# Patient Record
Sex: Female | Born: 1960
Health system: Southern US, Community
[De-identification: ages and names within clinical notes are randomized; demographics above are authoritative.]

## PROBLEM LIST (undated history)

## (undated) DIAGNOSIS — A6000 Herpesviral infection of urogenital system, unspecified: Secondary | ICD-10-CM

## (undated) DIAGNOSIS — E785 Hyperlipidemia, unspecified: Secondary | ICD-10-CM

## (undated) DIAGNOSIS — R7301 Impaired fasting glucose: Secondary | ICD-10-CM

## (undated) DIAGNOSIS — I340 Nonrheumatic mitral (valve) insufficiency: Secondary | ICD-10-CM

## (undated) DIAGNOSIS — K635 Polyp of colon: Secondary | ICD-10-CM

## (undated) DIAGNOSIS — E039 Hypothyroidism, unspecified: Secondary | ICD-10-CM

## (undated) DIAGNOSIS — E559 Vitamin D deficiency, unspecified: Secondary | ICD-10-CM

## (undated) DIAGNOSIS — R011 Cardiac murmur, unspecified: Secondary | ICD-10-CM

## (undated) DIAGNOSIS — M461 Sacroiliitis, not elsewhere classified: Secondary | ICD-10-CM

## (undated) HISTORY — DX: Hypothyroidism, unspecified: E03.9

## (undated) HISTORY — DX: Sacroiliitis, not elsewhere classified: M46.1

## (undated) HISTORY — DX: Nonrheumatic mitral (valve) insufficiency: I34.0

## (undated) HISTORY — DX: Cardiac murmur, unspecified: R01.1

## (undated) HISTORY — DX: Impaired fasting glucose: R73.01

## (undated) HISTORY — DX: Hyperlipidemia, unspecified: E78.5

## (undated) HISTORY — DX: Polyp of colon: K63.5

## (undated) HISTORY — DX: Vitamin D deficiency, unspecified: E55.9

## (undated) HISTORY — DX: Herpesviral infection of urogenital system, unspecified: A60.00

## (undated) HISTORY — PX: TUBAL LIGATION: SHX77

---

## 2008-06-27 DIAGNOSIS — M461 Sacroiliitis, not elsewhere classified: Secondary | ICD-10-CM

## 2008-06-27 HISTORY — DX: Sacroiliitis, not elsewhere classified: M46.1

## 2009-06-27 DIAGNOSIS — R011 Cardiac murmur, unspecified: Secondary | ICD-10-CM

## 2009-06-27 DIAGNOSIS — I340 Nonrheumatic mitral (valve) insufficiency: Secondary | ICD-10-CM

## 2009-06-27 HISTORY — DX: Nonrheumatic mitral (valve) insufficiency: I34.0

## 2009-06-27 HISTORY — DX: Cardiac murmur, unspecified: R01.1

## 2013-06-27 DIAGNOSIS — K635 Polyp of colon: Secondary | ICD-10-CM

## 2013-06-27 HISTORY — PX: COLONOSCOPY W/ POLYPECTOMY: SHX1380

## 2013-06-27 HISTORY — DX: Polyp of colon: K63.5

## 2014-01-02 DIAGNOSIS — Z86010 Personal history of colonic polyps: Secondary | ICD-10-CM | POA: Insufficient documentation

## 2016-03-22 ENCOUNTER — Ambulatory Visit (INDEPENDENT_AMBULATORY_CARE_PROVIDER_SITE_OTHER): Payer: BLUE CROSS/BLUE SHIELD | Admitting: Family Medicine

## 2016-03-22 ENCOUNTER — Encounter: Payer: Self-pay | Admitting: Family Medicine

## 2016-03-22 VITALS — BP 135/88 | HR 74 | Temp 97.9°F | Resp 20 | Ht 62.5 in | Wt 130.5 lb

## 2016-03-22 DIAGNOSIS — Z7689 Persons encountering health services in other specified circumstances: Secondary | ICD-10-CM

## 2016-03-22 DIAGNOSIS — K635 Polyp of colon: Secondary | ICD-10-CM

## 2016-03-22 DIAGNOSIS — Z7189 Other specified counseling: Secondary | ICD-10-CM | POA: Diagnosis not present

## 2016-03-22 DIAGNOSIS — A6000 Herpesviral infection of urogenital system, unspecified: Secondary | ICD-10-CM | POA: Insufficient documentation

## 2016-03-22 MED ORDER — VALACYCLOVIR HCL 500 MG PO TABS: 500.0000 mg | ORAL_TABLET | Freq: Every day | ORAL | 3 refills | Status: DC

## 2016-03-22 NOTE — Patient Instructions (Signed)
It was a pleasure meeting you today.  I will refer you to GI to keep you on track with your colonoscopy.  I have refilled your med for 1 year.  Physical end of April 2018.  If need anything let us know. Welcome to [facility]!

## 2016-03-22 NOTE — Progress Notes (Signed)
Patient ID: Sabrina Allison, female  DOB: 06-21-1961, 55 y.o.   MRN: [id number] No care team member to display  Subjective:  Sabrina Allison is a 55 y.o.  female present for new patient establishment. All past medical history, surgical history, allergies, family history, immunizations, medications and social history were obtained and entered  in the electronic medical record today. All recent labs, ED visits and hospitalizations within the last year were reviewed. She has recently relocated from [place] this past summer.   Health maintenance:  Colonoscopy: completed 2015, polyps, completed in [place]. Precancerous polyps, recall in 3 years. GI referral today.  Mammogram: completed 08/12/2014 Cervical cancer screening: last pap: 2016, results: normal, est with GYN in [place] Immunizations: tdap 2017, Influenza declined today(encouraged yearly Infectious disease screening: HIV and Hep C screen unknown, await records.  DEXA: N/A  Immunization History  Administered Date(s) Administered  . Hepatitis A 12/04/2015  . Tdap 12/04/2015    Past Medical History:  Diagnosis Date  . Colon polyp   . Genital herpes    No Known Allergies History reviewed. No pertinent surgical history. Family History  Problem Relation Age of Onset  . Diabetes Father   . Alcohol abuse Maternal Grandfather    Social History   Social History  . Marital status: Married    Spouse name: N/A  . Number of children: N/A  . Years of education: N/A   Occupational History  . Not on file.   Social History Main Topics  . Smoking status: Never Smoker  . Smokeless tobacco: Never Used  . Alcohol use 1.2 - 1.8 oz/week    2 - 3 Shots of liquor per week  . Drug use:     Frequency: 2.0 times per week    Types: Marijuana  . Sexual activity: Yes    Partners: Male    Birth control/ protection: Surgical     Comment: married   Other Topics Concern  . Not on file   Social History Narrative   Married to Sabrina Allison. 3 children  Sabrina Allison   Master's Degree in teaching.    Drinks caffeine.    Takes a daily vitamin   Wears her seatbelt, bicycle helmet, smoke detector in the home.    Exercises routinely   Feels safe in her relationships.         Medication List       Accurate as of 03/22/16 10:54 AM. Always use your most recent med list.          multivitamin capsule Take 1 capsule by mouth daily.   valACYclovir 500 MG tablet Commonly known as:  VALTREX Take 1 tablet (500 mg total) by mouth daily.        No results found for this or any previous visit (from the past 2160 hour(s)).  Patient was never admitted.   ROS: 14 pt review of systems performed and negative (unless mentioned in an HPI)  Objective: BP 135/88 (BP Location: Right Arm, Patient Position: Sitting, Cuff Size: Normal)   Pulse 74   Temp 97.9 F (36.6 C)   Resp 20   Ht 5' 2.5" (1.588 m)   Wt 130 lb 8 oz (59.2 kg)   SpO2 98%   BMI 23.49 kg/m  Gen: Afebrile. No acute distress. Nontoxic in appearance, well-developed, well-nourished,  Thin caucasian female. Pleasant.  HENT: AT. [place].  MMM, no oral lesions. no Cough on exam, no hoarseness on exam. Eyes:Pupils Equal Round Reactive to light, Extraocular  movements intact,  Conjunctiva without redness, discharge or icterus. Neck/lymp/endocrine: Supple,no lymphadenopathy, no thyromegaly CV: RRR, no edema Chest: CTAB, no wheeze, rhonchi or crackles.  Abd: Soft.NTND. BS present.  Skin: Warm and well-perfused. Skin intact. Neuro/Msk: Normal gait. PERLA. EOMi. Alert. Oriented x3.   Psych: Normal affect, dress and demeanor. Normal speech. Normal thought content and judgment.   Assessment/plan: [name] is a 55 y.o. female present for establishment of care.  Establishing care with new doctor, encounter for Genital HSV - valtrex 500 mg QD refilled for 1 year.   Colon polyps - 2015 colon polyps, reccommended 3 year recall. Needs to establish with GI in [place].  -  Ambulatory referral to Gastroenterology  - pt declined flu shot today.   Greater than 30 minutes spent with patient, >50% of time spent face to face counseling patient and coordinating care.  Return in about 7 months (around 10/24/2016) for CPE.  Electronically signed by: Howard Pouch, DO [place]

## 2016-03-29 ENCOUNTER — Encounter: Payer: Self-pay | Admitting: Family Medicine

## 2016-03-29 DIAGNOSIS — R7989 Other specified abnormal findings of blood chemistry: Secondary | ICD-10-CM | POA: Insufficient documentation

## 2016-03-29 DIAGNOSIS — E785 Hyperlipidemia, unspecified: Secondary | ICD-10-CM | POA: Insufficient documentation

## 2016-04-20 ENCOUNTER — Encounter: Payer: Self-pay | Admitting: Family Medicine

## 2016-05-31 ENCOUNTER — Telehealth: Payer: Self-pay | Admitting: Internal Medicine

## 2016-07-11 NOTE — Telephone Encounter (Signed)
Done

## 2016-07-21 NOTE — Telephone Encounter (Signed)
Received path report and placed on Dr. Celesta Aver desk for review.

## 2016-07-22 NOTE — Telephone Encounter (Signed)
Dr.Gessner reviewed records and accepted pt. Okay to sch direct colon. Left message for pt to cb and sch

## 2016-09-12 NOTE — Telephone Encounter (Signed)
Patient has not scheduled appointment. Records will be in "records reviewed" folder

## 2016-11-25 ENCOUNTER — Encounter: Payer: Self-pay | Admitting: Internal Medicine

## 2016-11-25 ENCOUNTER — Ambulatory Visit (INDEPENDENT_AMBULATORY_CARE_PROVIDER_SITE_OTHER): Payer: BLUE CROSS/BLUE SHIELD | Admitting: Family Medicine

## 2016-11-25 ENCOUNTER — Encounter: Payer: Self-pay | Admitting: Family Medicine

## 2016-11-25 ENCOUNTER — Other Ambulatory Visit: Payer: Self-pay | Admitting: Family Medicine

## 2016-11-25 VITALS — BP 139/89 | HR 63 | Temp 98.3°F | Resp 20 | Ht 63.0 in | Wt 133.0 lb

## 2016-11-25 DIAGNOSIS — Z1231 Encounter for screening mammogram for malignant neoplasm of breast: Secondary | ICD-10-CM

## 2016-11-25 DIAGNOSIS — R946 Abnormal results of thyroid function studies: Secondary | ICD-10-CM | POA: Diagnosis not present

## 2016-11-25 DIAGNOSIS — Z1239 Encounter for other screening for malignant neoplasm of breast: Secondary | ICD-10-CM

## 2016-11-25 DIAGNOSIS — K635 Polyp of colon: Secondary | ICD-10-CM | POA: Diagnosis not present

## 2016-11-25 DIAGNOSIS — E784 Other hyperlipidemia: Secondary | ICD-10-CM

## 2016-11-25 DIAGNOSIS — Z131 Encounter for screening for diabetes mellitus: Secondary | ICD-10-CM

## 2016-11-25 DIAGNOSIS — Z0000 Encounter for general adult medical examination without abnormal findings: Secondary | ICD-10-CM | POA: Diagnosis not present

## 2016-11-25 DIAGNOSIS — E7849 Other hyperlipidemia: Secondary | ICD-10-CM

## 2016-11-25 DIAGNOSIS — R7989 Other specified abnormal findings of blood chemistry: Secondary | ICD-10-CM

## 2016-11-25 LAB — CBC WITH DIFFERENTIAL/PLATELET
Basophils Absolute: 0 10*3/uL (ref 0.0–0.1)
Basophils Relative: 0.8 % (ref 0.0–3.0)
EOS PCT: 1.1 % (ref 0.0–5.0)
Eosinophils Absolute: 0.1 10*3/uL (ref 0.0–0.7)
HEMATOCRIT: 41.6 % (ref 36.0–46.0)
HEMOGLOBIN: 14 g/dL (ref 12.0–15.0)
LYMPHS ABS: 1.1 10*3/uL (ref 0.7–4.0)
LYMPHS PCT: 22.5 % (ref 12.0–46.0)
MCHC: 33.5 g/dL (ref 30.0–36.0)
MCV: 99.2 fl (ref 78.0–100.0)
MONOS PCT: 6.6 % (ref 3.0–12.0)
Monocytes Absolute: 0.3 10*3/uL (ref 0.1–1.0)
Neutro Abs: 3.4 10*3/uL (ref 1.4–7.7)
Neutrophils Relative %: 69 % (ref 43.0–77.0)
Platelets: 226 10*3/uL (ref 150.0–400.0)
RBC: 4.2 Mil/uL (ref 3.87–5.11)
RDW: 13.2 % (ref 11.5–15.5)
WBC: 4.9 10*3/uL (ref 4.0–10.5)

## 2016-11-25 LAB — COMPREHENSIVE METABOLIC PANEL
ALBUMIN: 4.9 g/dL (ref 3.5–5.2)
ALK PHOS: 44 U/L (ref 39–117)
ALT: 21 U/L (ref 0–35)
AST: 24 U/L (ref 0–37)
BILIRUBIN TOTAL: 0.6 mg/dL (ref 0.2–1.2)
BUN: 18 mg/dL (ref 6–23)
CALCIUM: 9.9 mg/dL (ref 8.4–10.5)
CO2: 31 mEq/L (ref 19–32)
Chloride: 102 mEq/L (ref 96–112)
Creatinine, Ser: 0.61 mg/dL (ref 0.40–1.20)
GFR: 108.17 mL/min (ref >60.00)
Glucose, Bld: 86 mg/dL (ref 70–99)
Potassium: 5.2 mEq/L — ABNORMAL HIGH (ref 3.5–5.1)
Sodium: 139 mEq/L (ref 135–145)
TOTAL PROTEIN: 7.3 g/dL (ref 6.0–8.3)

## 2016-11-25 LAB — LIPID PANEL
CHOLESTEROL: 270 mg/dL — AB (ref 0–200)
HDL: 108.1 mg/dL (ref >39.00)
LDL Cholesterol: 149 mg/dL — ABNORMAL HIGH (ref 0–99)
NonHDL: 161.88
Total CHOL/HDL Ratio: 2
Triglycerides: 64 mg/dL (ref 0.0–149.0)
VLDL: 12.8 mg/dL (ref 0.0–40.0)

## 2016-11-25 LAB — TSH: TSH: 2.82 u[IU]/mL (ref 0.35–4.50)

## 2016-11-25 LAB — HEMOGLOBIN A1C: HEMOGLOBIN A1C: 5.3 % (ref 4.6–6.5)

## 2016-11-25 MED ORDER — ZOSTER VAC RECOMB ADJUVANTED 50 MCG/0.5ML IM SUSR: INTRAMUSCULAR | 1 refills | Status: DC

## 2016-11-25 NOTE — Patient Instructions (Signed)

## 2016-11-25 NOTE — Progress Notes (Signed)
Patient ID: Kristeen Lantz, female  DOB: January 12, 1961, 56 y.o.   MRN: [id number] Patient Care Team    Relationship Specialty Notifications Start End  Ma Hillock, DO PCP - General Family Medicine  03/29/16     Subjective:  Kiyonna Tortorelli is a 56 y.o.  female present for new patient establishment. All past medical history, surgical history, allergies, family history, immunizations, medications and social history were updated and entered  in the electronic medical record today.   Health maintenance: Reviewed and updated in first 2018 Colonoscopy: completed 2015, polyps, completed in [place]. Precancerous polyps, recall in 3 years. GI referral again today.  Mammogram: completed 08/12/2014, referral placed to breast center today Cervical cancer screening: last pap: 2016, results: normal, est with GYN in  Immunizations: tdap 2017, Influenza declined today(encouraged yearly), shingrix script provided today Infectious disease screening: HIV and Hep C Declined today. DEXA: N/A No hospitalization or ED visits.  Pt has dental home.    Immunization History  Administered Date(s) Administered  . Hepatitis A 12/04/2015  . Tdap 12/04/2015    Past Medical History:  Diagnosis Date  . Colon polyp 2015   tubular adenoma, sessile serrated adenoma  . Elevated fasting glucose   . Genital herpes   . Hyperlipidemia   . Hypothyroid   . Mitral regurgitation 2011   trace  . Murmur 2011  . Sacroiliitis ([place]) 2010  . Vitamin D deficiency    No Known Allergies Past Surgical History:  Procedure Laterality Date  . COLONOSCOPY W/ POLYPECTOMY  2015   tubular adenoma, sessile serrated adenoma - 3 year recall per records.   . TUBAL LIGATION     Family History  Problem Relation Age of Onset  . Hyperlipidemia Mother   . Hypertension Mother   . Diabetes Father 20       IDDM at age 66  . Alcohol abuse Maternal Grandfather   . Diabetes Brother    Social History   Social History  . Marital  status: Married    Spouse name: N/A  . Number of children: N/A  . Years of education: N/A   Occupational History  . Not on file.   Social History Main Topics  . Smoking status: Never Smoker  . Smokeless tobacco: Never Used  . Alcohol use 1.2 - 1.8 oz/week    2 - 3 Shots of liquor per week  . Drug use: Yes    Frequency: 2.0 times per week    Types: Marijuana  . Sexual activity: Yes    Partners: Male    Birth control/ protection: Surgical     Comment: married   Other Topics Concern  . Not on file   Social History Narrative   Married to [place]. 3 children [name]   Master's Degree in teaching.    Drinks caffeine.    Takes a daily vitamin   Wears her seatbelt, bicycle helmet, smoke detector in the home.    Exercises routinely   Feels safe in her relationships.       Allergies as of 11/25/2016   No Known Allergies     Medication List       Accurate as of 11/25/16  7:21 PM. Always use your most recent med list.          multivitamin capsule Take 1 capsule by mouth daily.   valACYclovir 500 MG tablet Commonly known as:  VALTREX Take 1 tablet (500 mg total) by mouth daily.  Zoster Vac Recomb Adjuvanted injection Commonly known as:  SHINGRIX %0 mcg IM once, repeat in 2-6 months x1        Recent Results (from the past 2160 hour(s))  CBC w/Diff     Status: None   Collection Time: 11/25/16  8:51 AM  Result Value Ref Range   WBC 4.9 4.0 - 10.5 K/uL   RBC 4.20 3.87 - 5.11 Mil/uL   Hemoglobin 14.0 12.0 - 15.0 g/dL   HCT 41.6 36.0 - 46.0 %   MCV 99.2 78.0 - 100.0 fl   MCHC 33.5 30.0 - 36.0 g/dL   RDW 13.2 11.5 - 15.5 %   Platelets 226.0 150.0 - 400.0 K/uL   Neutrophils Relative % 69.0 43.0 - 77.0 %   Lymphocytes Relative 22.5 12.0 - 46.0 %   Monocytes Relative 6.6 3.0 - 12.0 %   Eosinophils Relative 1.1 0.0 - 5.0 %   Basophils Relative 0.8 0.0 - 3.0 %   Neutro Abs 3.4 1.4 - 7.7 K/uL   Lymphs Abs 1.1 0.7 - 4.0 K/uL   Monocytes Absolute 0.3 0.1  - 1.0 K/uL   Eosinophils Absolute 0.1 0.0 - 0.7 K/uL   Basophils Absolute 0.0 0.0 - 0.1 K/uL  Comp Met (CMET)     Status: Abnormal   Collection Time: 11/25/16  8:51 AM  Result Value Ref Range   Sodium 139 135 - 145 mEq/L   Potassium 5.2 (H) 3.5 - 5.1 mEq/L   Chloride 102 96 - 112 mEq/L   CO2 31 19 - 32 mEq/L   Glucose, Bld 86 70 - 99 mg/dL   BUN 18 6 - 23 mg/dL   Creatinine, Ser 0.61 0.40 - 1.20 mg/dL   Total Bilirubin 0.6 0.2 - 1.2 mg/dL   Alkaline Phosphatase 44 39 - 117 U/L   AST 24 0 - 37 U/L   ALT 21 0 - 35 U/L   Total Protein 7.3 6.0 - 8.3 g/dL   Albumin 4.9 3.5 - 5.2 g/dL   Calcium 9.9 8.4 - 10.5 mg/dL   GFR 108.17 >60.00 mL/min  Lipid panel     Status: Abnormal   Collection Time: 11/25/16  8:51 AM  Result Value Ref Range   Cholesterol 270 (H) 0 - 200 mg/dL    Comment: ATP III Classification       Desirable:  < 200 mg/dL               Borderline High:  200 - 239 mg/dL          High:  > = 240 mg/dL   Triglycerides 64.0 0.0 - 149.0 mg/dL    Comment: Normal:  <150 mg/dLBorderline High:  150 - 199 mg/dL   HDL 108.10 >39.00 mg/dL   VLDL 12.8 0.0 - 40.0 mg/dL   LDL Cholesterol 149 (H) 0 - 99 mg/dL   Total CHOL/HDL Ratio 2     Comment:                Men          Women1/2 Average Risk     3.4          3.3Average Risk          5.0          4.42X Average Risk          9.6          7.13X Average Risk          15.0  11.0                       NonHDL 161.88     Comment: NOTE:  Non-HDL goal should be 30 mg/dL higher than patient's LDL goal (i.e. LDL goal of < 70 mg/dL, would have non-HDL goal of < 100 mg/dL)  HgB A1c     Status: None   Collection Time: 11/25/16  8:51 AM  Result Value Ref Range   Hgb A1c MFr Bld 5.3 4.6 - 6.5 %    Comment: Glycemic Control Guidelines for People with Diabetes:Non Diabetic:  <6%Goal of Therapy: <7%Additional Action Suggested:  >8%   TSH     Status: None   Collection Time: 11/25/16  8:51 AM  Result Value Ref Range   TSH 2.82 0.35 - 4.50  uIU/mL    Patient was never admitted.   ROS: 14 pt review of systems performed and negative (unless mentioned in an HPI)  Objective: BP 139/89 (BP Location: Left Arm, Patient Position: Sitting, Cuff Size: Normal)   Pulse 63   Temp 98.3 F (36.8 C)   Resp 20   Ht '5\' 3"'  (1.6 m)   Wt 133 lb (60.3 kg)   SpO2 98%   BMI 23.56 kg/m  Gen: Afebrile. No acute distress. Nontoxic in appearance, well-developed, well-nourished, thin Caucasian female. Very pleasant. HENT: AT. [place]. Bilateral TM visualized and normal in appearance. MMM. Bilateral nares without erythema or swelling. Throat without erythema or exudates. No cough, no hoarseness. Eyes:Pupils Equal Round Reactive to light, Extraocular movements intact,  Conjunctiva without redness, discharge or icterus. Neck/lymp/endocrine: Supple, no lymphadenopathy, no thyromegaly CV: RRR no murmur, no edema, +2/4 P posterior tibialis pulses Chest: CTAB, no wheeze or crackles Abd: Soft. Flat. NTND. BS present. No Masses palpated.  MSK: No erythema, no soft tissue swelling, no obvious deformities, full range of motion. Neurovascularly intact. Skin: No rashes, purpura or petechiae. Warm well perfused, intact Neuro:  Normal gait. PERLA. EOMi. Alert. Oriented. Cranial nerves II through XII intact. Muscle strength 5/5 upper and lower extremity. DTRs equal bilaterally. Psych: Normal affect, dress and demeanor. Normal speech. Normal thought content and judgment.  Assessment/plan: Lamerle Jabs is a 56 y.o. female present for CPE Other hyperlipidemia - Lipid panel Elevated TSH - TSH Encounter for preventive health examination - AVS provided for routine health maintenance education. Patient was encouraged to exercise greater than 150 minutes a week, consuming a healthy diet. Colonoscopy: completed 2015, polyps, completed in [place]. Precancerous polyps, recall in 3 years. GI referral again today.  Mammogram: completed 08/12/2014, referral placed to breast  center today Cervical cancer screening: last pap: 2016, results: normal, est with GYN in [place] Immunizations: tdap 2017, Influenza declined today(encouraged yearly), shingrix script provided today Infectious disease screening: HIV and Hep C Declined today. - CBC w/Diff - Comp Met (CMET) Screening for diabetes mellitus - HgB A1c Polyp of colon, unspecified part of colon, unspecified type - Placed referral again per patient, she just needs scheduled the [facility] does have her records now. - Ambulatory referral to Gastroenterology Breast cancer screening - Mammogram scheduled for the breast center    No Follow-up on file.  Electronically signed by: Howard Pouch, DO [place]

## 2016-11-28 ENCOUNTER — Telehealth: Payer: Self-pay | Admitting: Family Medicine

## 2016-11-28 NOTE — Telephone Encounter (Signed)
Please call pt: - he labs look great. Her total cholesterol number is high, but most of total cholesterol number  is from her HDL (good cholesterol) which is amazing in her. We want the HDL high and LDL <130. - Continue low saturated fat diet, higher fiber, and exercise. Could add a fish oil supplement if desire/tolerate to help lower LDL (bad cholesterol). Overall panel look good.   Lipid Panel     Component Value Date/Time   CHOL 270 (H) 11/25/2016 0851   TRIG 64.0 11/25/2016 0851   HDL 108.10 11/25/2016 0851   CHOLHDL 2 11/25/2016 0851   VLDL 12.8 11/25/2016 0851   LDLCALC 149 (H) 11/25/2016 4458

## 2016-11-28 NOTE — Telephone Encounter (Signed)
Left detailed message with lab results and instructions on patient voice mail per DPR.

## 2016-11-29 ENCOUNTER — Encounter: Payer: Self-pay | Admitting: Family Medicine

## 2016-12-05 ENCOUNTER — Ambulatory Visit (INDEPENDENT_AMBULATORY_CARE_PROVIDER_SITE_OTHER): Payer: BLUE CROSS/BLUE SHIELD | Admitting: Family Medicine

## 2016-12-05 ENCOUNTER — Encounter: Payer: Self-pay | Admitting: Family Medicine

## 2016-12-05 VITALS — BP 132/88 | HR 80 | Temp 98.0°F | Resp 16 | Wt 134.0 lb

## 2016-12-05 DIAGNOSIS — L258 Unspecified contact dermatitis due to other agents: Secondary | ICD-10-CM

## 2016-12-05 MED ORDER — METHYLPREDNISOLONE ACETATE 80 MG/ML IJ SUSP
80.0000 mg | Freq: Once | INTRAMUSCULAR | Status: AC
Start: 2016-12-05 — End: 2016-12-05
  Administered 2016-12-05: 80 mg via INTRAMUSCULAR

## 2016-12-05 NOTE — Progress Notes (Signed)
Sabrina Allison , [birth year], 56 y.o., female MRN: [id number] Patient Care Team    Relationship Specialty Notifications Start End  Ma Hillock, DO PCP - General Family Medicine  03/29/16     Chief Complaint  Patient presents with  . Rash    Bilateral leg, itching, x 8 days     Subjective: Pt presents for an OV with complaints of bilateral leg itchiness of 8 days duration.  Associated symptoms include rash. Pt states she had a rash on her left ankle after gardening a few days prior. They then went to the beach and she noticed the rash spread to the other leg. She denies pain. She thought it was HSV, she has a h/o genital HSV so she increased her valtrex over the last two days. She has not seen an improvement with valtrex use.   Depression screen [facility] 2/9 11/25/2016 03/22/2016  Decreased Interest 0 0  Down, Depressed, Hopeless 0 0  PHQ - 2 Score 0 0    No Known Allergies Social History  Substance Use Topics  . Smoking status: Never Smoker  . Smokeless tobacco: Never Used  . Alcohol use 1.2 - 1.8 oz/week    2 - 3 Shots of liquor per week   Past Medical History:  Diagnosis Date  . Colon polyp 2015   tubular adenoma, sessile serrated adenoma  . Elevated fasting glucose   . Genital herpes   . Hyperlipidemia   . Hypothyroid   . Mitral regurgitation 2011   trace  . Murmur 2011  . Sacroiliitis ([place]) 2010  . Vitamin D deficiency    Past Surgical History:  Procedure Laterality Date  . COLONOSCOPY W/ POLYPECTOMY  2015   tubular adenoma, sessile serrated adenoma - 3 year recall per records.   . TUBAL LIGATION     Family History  Problem Relation Age of Onset  . Hyperlipidemia Mother   . Hypertension Mother   . Diabetes Father 48       IDDM at age 58  . Alcohol abuse Maternal Grandfather   . Diabetes Brother    Allergies as of 12/05/2016   No Known Allergies     Medication List       Accurate as of 12/05/16 11:23 AM. Always use your most recent med list.          multivitamin capsule Take 1 capsule by mouth daily.   valACYclovir 500 MG tablet Commonly known as:  VALTREX Take 1 tablet (500 mg total) by mouth daily.   Zoster Vac Recomb Adjuvanted injection Commonly known as:  SHINGRIX %0 mcg IM once, repeat in 2-6 months x1       All past medical history, surgical history, allergies, family history, immunizations andmedications were updated in the EMR today and reviewed under the history and medication portions of their EMR.     ROS: Negative, with the exception of above mentioned in HPI   Objective:  BP (!) 147/96 (BP Location: Left Arm, Patient Position: Sitting, Cuff Size: Normal)   Pulse 80   Temp 98 F (36.7 C) (Oral)   Resp 16   Wt 134 lb (60.8 kg)   SpO2 96%   BMI 23.74 kg/m  Body mass index is 23.74 kg/m. Gen: Afebrile. No acute distress. Nontoxic in appearance, well developed, well nourished.  Skin:  Left medial ankle oval shaped vesicles and 1 small area by left medial knee, right calf with 4 small areas. No purpura or petechiae.   No  exam data present No results found. No results found for this or any previous visit (from the past 24 hour(s)).  Assessment/Plan: Sabrina Allison is a 56 y.o. female present for OV for  Contact dermatitis due to other agent, unspecified contact dermatitis type - appears to be plant dermatitis.  - since spreading offered IM depo medrol, and pt agreed today.  - avoid scratching. OTC calamine lotion. Steroid cream if needed.  - F/U PRN  Reviewed expectations re: course of current medical issues.  Discussed self-management of symptoms.  Outlined signs and symptoms indicating need for more acute intervention.  Patient verbalized understanding and all questions were answered.  Patient received an After-Visit Summary.   Note is dictated utilizing voice recognition software. Although note has been proof read prior to signing, occasional typographical errors still can be missed. If any  questions arise, please do not hesitate to call for verification.   electronically signed by:  Howard Pouch, DO  [place]

## 2016-12-05 NOTE — Patient Instructions (Signed)
Keep area covered with calamine lotion to dry it.  Steroid shot today to help.  If this is sumac or oak can take  A few weeks to resolve.  Wear pants, keep covered, try not to scratch.   Needs to dry out.     Poison Ivy Dermatitis Poison ivy dermatitis is redness and soreness (inflammation) of the skin. It is caused by a chemical that is found on the leaves of the poison ivy plant. You may also have itching, a rash, and blisters. Symptoms often clear up in 1-2 weeks. You may get this condition by touching a poison ivy plant. You can also get it by touching something that has the chemical on it. This may include animals or objects that have come in contact with the plant. Follow these instructions at home: General instructions  Take or apply over-the-counter and prescription medicines only as told by your doctor.  If you touch poison ivy, wash your skin with soap and cold water right away.  Use hydrocortisone creams or calamine lotion as needed to help with itching.  Take oatmeal baths as needed. Use colloidal oatmeal. You can get this at a pharmacy or grocery store. Follow the instructions on the package.  Do not scratch or rub your skin.  While you have the rash, wash your clothes right after you wear them. Prevention  Know what poison ivy looks like so you can avoid it. This plant has three leaves with flowering branches on a single stem. The leaves are glossy. They have uneven edges that come to a point at the front.  If you have touched poison ivy, wash with soap and water right away. Be sure to wash under your fingernails.  When hiking or camping, wear long pants, a long-sleeved shirt, tall socks, and hiking boots. You can also use a lotion on your skin that helps to prevent contact with the chemical on the plant.  If you think that your clothes or outdoor gear came in contact with poison ivy, rinse them off with a garden hose before you bring them inside your house. Contact  a doctor if:  You have open sores in the rash area.  You have more redness, swelling, or pain in the affected area.  You have redness that spreads beyond the rash area.  You have fluid, blood, or pus coming from the affected area.  You have a fever.  You have a rash over a large area of your body.  You have a rash on your eyes, mouth, or genitals.  Your rash does not get better after a few days. Get help right away if:  Your face swells or your eyes swell shut.  You have trouble breathing.  You have trouble swallowing. This information is not intended to replace advice given to you by your health care provider. Make sure you discuss any questions you have with your health care provider. Document Released: 07/16/2010 Document Revised: 11/19/2015 Document Reviewed: 11/19/2014 Elsevier Interactive Patient Education  [employer].

## 2016-12-07 ENCOUNTER — Ambulatory Visit
Admission: RE | Admit: 2016-12-07 | Discharge: 2016-12-07 | Disposition: A | Discharge status: Home / Self Care | Payer: BLUE CROSS/BLUE SHIELD | Source: Ambulatory Visit | Attending: Family Medicine | Admitting: Family Medicine

## 2016-12-07 DIAGNOSIS — Z1231 Encounter for screening mammogram for malignant neoplasm of breast: Secondary | ICD-10-CM

## 2016-12-29 ENCOUNTER — Ambulatory Visit (AMBULATORY_SURGERY_CENTER): Payer: Self-pay

## 2016-12-29 VITALS — Ht 62.5 in | Wt 133.0 lb

## 2016-12-29 DIAGNOSIS — Z1211 Encounter for screening for malignant neoplasm of colon: Secondary | ICD-10-CM

## 2016-12-29 NOTE — Progress Notes (Signed)
No allergies to eggs or soy No diet meds No home oxygem No past problems with anesthesia  Registered emmi

## 2017-01-09 ENCOUNTER — Encounter: Payer: Self-pay | Admitting: Internal Medicine

## 2017-01-12 ENCOUNTER — Ambulatory Visit (AMBULATORY_SURGERY_CENTER): Payer: BLUE CROSS/BLUE SHIELD | Admitting: Internal Medicine

## 2017-01-12 ENCOUNTER — Encounter: Payer: Self-pay | Admitting: Internal Medicine

## 2017-01-12 VITALS — BP 137/89 | HR 51 | Temp 98.7°F | Resp 14 | Ht 68.5 in | Wt 173.0 lb

## 2017-01-12 DIAGNOSIS — D123 Benign neoplasm of transverse colon: Secondary | ICD-10-CM | POA: Diagnosis not present

## 2017-01-12 DIAGNOSIS — Z86010 Personal history of colonic polyps: Secondary | ICD-10-CM | POA: Diagnosis not present

## 2017-01-12 MED ORDER — SODIUM CHLORIDE 0.9 % IV SOLN: 500.0000 mL | INTRAVENOUS | Status: DC

## 2017-01-12 NOTE — Progress Notes (Signed)
Called to room to assist during endoscopic procedure.  Patient ID and intended procedure confirmed with present staff. Received instructions for my participation in the procedure from the performing physician.  

## 2017-01-12 NOTE — Progress Notes (Signed)
Report to PACU, RN, vss, BBS= Clear.  

## 2017-01-12 NOTE — Progress Notes (Signed)
I have reviewed the patient's medical history in detail and updated the computerized patient record.

## 2017-01-12 NOTE — Patient Instructions (Addendum)
I found and removed a very tiny polyp. I will let you know pathology results and when to have another routine colonoscopy by mail and/or My Chart. It should be at least 5 years from now.  I appreciate the opportunity to care for you. Gatha Mayer, MD, FACG   YOU HAD AN ENDOSCOPIC PROCEDURE TODAY AT [facility]:   Refer to the procedure report that was given to you for any specific questions about what was found during the examination.  If the procedure report does not answer your questions, please call your gastroenterologist to clarify.  If you requested that your care partner not be given the details of your procedure findings, then the procedure report has been included in a sealed envelope for you to review at your convenience later.  YOU SHOULD EXPECT: Some feelings of bloating in the abdomen. Passage of more gas than usual.  Walking can help get rid of the air that was put into your GI tract during the procedure and reduce the bloating. If you had a lower endoscopy (such as a colonoscopy or flexible sigmoidoscopy) you may notice spotting of blood in your stool or on the toilet paper. If you underwent a bowel prep for your procedure, you may not have a normal bowel movement for a few days.  Please Note:  You might notice some irritation and congestion in your nose or some drainage.  This is from the oxygen used during your procedure.  There is no need for concern and it should clear up in a day or so.  SYMPTOMS TO REPORT IMMEDIATELY:   Following lower endoscopy (colonoscopy or flexible sigmoidoscopy):  Excessive amounts of blood in the stool  Significant tenderness or worsening of abdominal pains  Swelling of the abdomen that is new, acute  Fever of 100F or higher    For urgent or emergent issues, a gastroenterologist can be reached at any hour by calling [phone].   DIET:  We do recommend a small meal at first, but then you may proceed to your  regular diet.  Drink plenty of fluids but you should avoid alcoholic beverages for 24 hours.  ACTIVITY:  You should plan to take it easy for the rest of today and you should NOT DRIVE or use heavy machinery until tomorrow (because of the sedation medicines used during the test).    FOLLOW UP: Our staff will call the number listed on your records the next business day following your procedure to check on you and address any questions or concerns that you may have regarding the information given to you following your procedure. If we do not reach you, we will leave a message.  However, if you are feeling well and you are not experiencing any problems, there is no need to return our call.  We will assume that you have returned to your regular daily activities without incident.  If any biopsies were taken you will be contacted by phone or by letter within the next 1-3 weeks.  Please call us at [phone] if you have not heard about the biopsies in 3 weeks.    SIGNATURES/CONFIDENTIALITY: You and/or your care partner have signed paperwork which will be entered into your electronic medical record.  These signatures attest to the fact that that the information above on your After Visit Summary has been reviewed and is understood.  Full responsibility of the confidentiality of this discharge information lies with you and/or your care-partner.  INFORMATION ON POLYPS AND HEMORRHOIDS GIVEN TO YOU TODAY

## 2017-01-12 NOTE — Op Note (Signed)
[facility] Patient Name: Sabrina Allison Procedure Date: 01/12/2017 1:54 PM MRN: [id number] Endoscopist: Gatha Mayer , MD Age: 56 Referring MD:  Date of Birth: 21-Mar-1961 Gender: Female Account #: [id number] Procedure:                Colonoscopy Indications:              Surveillance: Personal history of adenomatous                            polyps on last colonoscopy 3 years ago Medicines:                Propofol per Anesthesia, Monitored Anesthesia Care Procedure:                Pre-Anesthesia Assessment:                           - Prior to the procedure, a History and Physical                            was performed, and patient medications and                            allergies were reviewed. The patient's tolerance of                            previous anesthesia was also reviewed. The risks                            and benefits of the procedure and the sedation                            options and risks were discussed with the patient.                            All questions were answered, and informed consent                            was obtained. Prior Anticoagulants: The patient has                            taken no previous anticoagulant or antiplatelet                            agents. ASA Grade Assessment: II - A patient with                            mild systemic disease. After reviewing the risks                            and benefits, the patient was deemed in                            satisfactory condition to undergo the procedure.  After obtaining informed consent, the colonoscope                            was passed under direct vision. Throughout the                            procedure, the patient's blood pressure, pulse, and                            oxygen saturations were monitored continuously. The                            Colonoscope was introduced through the anus and   advanced to the the cecum, identified by                            appendiceal orifice and ileocecal valve. The                            colonoscopy was performed without difficulty. The                            patient tolerated the procedure well. The quality                            of the bowel preparation was good. The ileocecal                            valve, appendiceal orifice, and rectum were                            photographed. The bowel preparation used was                            Miralax. Scope In: 2:08:11 PM Scope Out: 2:22:41 PM Scope Withdrawal Time: 0 hours 10 minutes 47 seconds  Total Procedure Duration: 0 hours 14 minutes 30 seconds  Findings:                 The perianal and digital rectal examinations were                            normal.                           A diminutive polyp was found in the transverse                            colon. The polyp was flat. The polyp was removed                            with a cold snare. Resection and retrieval were                            complete. Verification of patient identification  for the specimen was done. Estimated blood loss was                            minimal.                           Internal hemorrhoids were found during retroflexion.                           The exam was otherwise without abnormality on                            direct and retroflexion views. Complications:            No immediate complications. Estimated Blood Loss:     Estimated blood loss was minimal. Impression:               - One diminutive polyp in the transverse colon,                            removed with a cold snare. Resected and retrieved.                           - Internal hemorrhoids.                           - The examination was otherwise normal on direct                            and retroflexion views.                           - Personal history of colonic polyps.  tubular                            adenoma and ssa/p 2015 Recommendation:           - Patient has a contact number available for                            emergencies. The signs and symptoms of potential                            delayed complications were discussed with the                            patient. Return to normal activities tomorrow.                            Written discharge instructions were provided to the                            patient.                           - Resume previous diet.                           -  Continue present medications.                           - Repeat colonoscopy is recommended for                            surveillance. The colonoscopy date will be                            determined after pathology results from today's                            exam become available for review. Gatha Mayer, MD 01/12/2017 2:31:02 PM This report has been signed electronically.

## 2017-01-13 ENCOUNTER — Telehealth: Payer: Self-pay

## 2017-01-13 NOTE — Telephone Encounter (Signed)
  Follow up Call-  Call back number 01/12/2017  Post procedure Call Back phone  # [phone]  Permission to leave phone message Yes  Some recent data might be hidden     Patient questions:  Do you have a fever, pain , or abdominal swelling? No. Pain Score  0 *  Have you tolerated food without any problems? Yes.    Have you been able to return to your normal activities? Yes.    Do you have any questions about your discharge instructions: Diet   No. Medications  No. Follow up visit  No.  Do you have questions or concerns about your Care? No.  Actions: * If pain score is 4 or above: No action needed, pain <4.  No problems noted per pt. maw

## 2017-01-18 ENCOUNTER — Encounter: Payer: Self-pay | Admitting: Internal Medicine

## 2017-01-18 NOTE — Progress Notes (Signed)
Diminutive ssp Recall 2023 My Chart letter

## 2017-01-24 ENCOUNTER — Encounter: Payer: BLUE CROSS/BLUE SHIELD | Admitting: Internal Medicine

## 2017-01-31 ENCOUNTER — Other Ambulatory Visit: Payer: Self-pay | Admitting: *Deleted

## 2017-01-31 MED ORDER — VALACYCLOVIR HCL 500 MG PO TABS: 500.0000 mg | ORAL_TABLET | Freq: Every day | ORAL | 3 refills | Status: DC

## 2017-04-02 ENCOUNTER — Encounter: Payer: Self-pay | Admitting: Family Medicine

## 2017-04-03 ENCOUNTER — Other Ambulatory Visit: Payer: Self-pay | Admitting: *Deleted

## 2017-04-03 MED ORDER — VALACYCLOVIR HCL 500 MG PO TABS: 500.0000 mg | ORAL_TABLET | Freq: Every day | ORAL | 3 refills | Status: DC

## 2017-04-18 ENCOUNTER — Other Ambulatory Visit: Payer: Self-pay | Admitting: Family Medicine

## 2017-06-28 ENCOUNTER — Encounter: Payer: Self-pay | Admitting: Family Medicine

## 2018-01-09 ENCOUNTER — Encounter: Payer: Self-pay | Admitting: Family Medicine

## 2018-01-09 ENCOUNTER — Ambulatory Visit (INDEPENDENT_AMBULATORY_CARE_PROVIDER_SITE_OTHER): Payer: Managed Care, Other (non HMO) | Admitting: Family Medicine

## 2018-01-09 VITALS — BP 129/84 | HR 63 | Temp 98.1°F | Resp 20 | Ht 63.0 in | Wt 132.0 lb

## 2018-01-09 DIAGNOSIS — A6000 Herpesviral infection of urogenital system, unspecified: Secondary | ICD-10-CM

## 2018-01-09 DIAGNOSIS — Z79899 Other long term (current) drug therapy: Secondary | ICD-10-CM | POA: Diagnosis not present

## 2018-01-09 DIAGNOSIS — Z86010 Personal history of colonic polyps: Secondary | ICD-10-CM | POA: Diagnosis not present

## 2018-01-09 DIAGNOSIS — Z0000 Encounter for general adult medical examination without abnormal findings: Secondary | ICD-10-CM

## 2018-01-09 DIAGNOSIS — R7989 Other specified abnormal findings of blood chemistry: Secondary | ICD-10-CM | POA: Diagnosis not present

## 2018-01-09 DIAGNOSIS — Z01419 Encounter for gynecological examination (general) (routine) without abnormal findings: Secondary | ICD-10-CM | POA: Diagnosis not present

## 2018-01-09 DIAGNOSIS — E7849 Other hyperlipidemia: Secondary | ICD-10-CM

## 2018-01-09 DIAGNOSIS — Z130 Encounter for screening for diseases of the blood and blood-forming organs and certain disorders involving the immune mechanism: Secondary | ICD-10-CM | POA: Diagnosis not present

## 2018-01-09 DIAGNOSIS — Z131 Encounter for screening for diabetes mellitus: Secondary | ICD-10-CM

## 2018-01-09 DIAGNOSIS — Z23 Encounter for immunization: Secondary | ICD-10-CM | POA: Diagnosis not present

## 2018-01-09 LAB — CBC WITH DIFFERENTIAL/PLATELET
Basophils Absolute: 0 10*3/uL (ref 0.0–0.1)
Basophils Relative: 0.7 % (ref 0.0–3.0)
EOS PCT: 0.7 % (ref 0.0–5.0)
Eosinophils Absolute: 0 10*3/uL (ref 0.0–0.7)
HCT: 41.2 % (ref 36.0–46.0)
HEMOGLOBIN: 13.8 g/dL (ref 12.0–15.0)
LYMPHS ABS: 1.1 10*3/uL (ref 0.7–4.0)
Lymphocytes Relative: 29.5 % (ref 12.0–46.0)
MCHC: 33.5 g/dL (ref 30.0–36.0)
MCV: 101.2 fl — ABNORMAL HIGH (ref 78.0–100.0)
MONOS PCT: 8.2 % (ref 3.0–12.0)
Monocytes Absolute: 0.3 10*3/uL (ref 0.1–1.0)
Neutro Abs: 2.3 10*3/uL (ref 1.4–7.7)
Neutrophils Relative %: 60.9 % (ref 43.0–77.0)
Platelets: 190 10*3/uL (ref 150.0–400.0)
RBC: 4.08 Mil/uL (ref 3.87–5.11)
RDW: 14 % (ref 11.5–15.5)
WBC: 3.8 10*3/uL — ABNORMAL LOW (ref 4.0–10.5)

## 2018-01-09 LAB — LIPID PANEL
CHOLESTEROL: 275 mg/dL — AB (ref 0–200)
HDL: 120.7 mg/dL (ref >39.00)
LDL Cholesterol: 144 mg/dL — ABNORMAL HIGH (ref 0–99)
NonHDL: 154.64
Total CHOL/HDL Ratio: 2
Triglycerides: 51 mg/dL (ref 0.0–149.0)
VLDL: 10.2 mg/dL (ref 0.0–40.0)

## 2018-01-09 LAB — TSH: TSH: 4.97 u[IU]/mL — ABNORMAL HIGH (ref 0.35–4.50)

## 2018-01-09 LAB — COMPREHENSIVE METABOLIC PANEL
ALBUMIN: 4.8 g/dL (ref 3.5–5.2)
ALK PHOS: 44 U/L (ref 39–117)
ALT: 25 U/L (ref 0–35)
AST: 33 U/L (ref 0–37)
BUN: 17 mg/dL (ref 6–23)
CO2: 31 mEq/L (ref 19–32)
Calcium: 9.6 mg/dL (ref 8.4–10.5)
Chloride: 98 mEq/L (ref 96–112)
Creatinine, Ser: 0.62 mg/dL (ref 0.40–1.20)
GFR: 105.73 mL/min (ref >60.00)
Glucose, Bld: 89 mg/dL (ref 70–99)
POTASSIUM: 5 meq/L (ref 3.5–5.1)
Sodium: 137 mEq/L (ref 135–145)
TOTAL PROTEIN: 7.4 g/dL (ref 6.0–8.3)
Total Bilirubin: 0.7 mg/dL (ref 0.2–1.2)

## 2018-01-09 LAB — HEMOGLOBIN A1C: Hgb A1c MFr Bld: 5.3 % (ref 4.6–6.5)

## 2018-01-09 MED ORDER — VALACYCLOVIR HCL 500 MG PO TABS: 500.0000 mg | ORAL_TABLET | Freq: Every day | ORAL | 3 refills | Status: DC

## 2018-01-09 NOTE — Progress Notes (Signed)
Patient ID: Sabrina Allison, female  DOB: 1961-05-07, 57 y.o.   MRN: [id number] Patient Care Team    Relationship Specialty Notifications Start End  Ma Hillock, DO PCP - General Family Medicine  03/29/16     Chief Complaint  Patient presents with  . Annual Exam    Subjective:  Sabrina Allison is a 57 y.o.  Female  present for CPE . All past medical history, surgical history, allergies, family history, immunizations, medications and social history were updated in the electronic medical record today. All recent labs, ED visits and hospitalizations within the last year were reviewed.   Health maintenance: updated 01/09/18 Colonoscopy: completed 01/12/2017, polyps, 5 yr f/u, Dr. Carlean Purl Mammogram: completed 12/07/2016, breast center.[facility], desires every 2 yr screen. Referred to GYN Cervical cancer screening: last pap: 2017, results: normal, referred to gyn Immunizations: tdap 2017 UTD, Influenza (encouraged yearly), shingrix script provided today Infectious disease screening: HIV and Hep C Declined today. DEXA: N/A No hospitalization or ED visits.  Pt has dental home.   Depression screen [facility] 2/9 01/09/2018 11/25/2016 03/22/2016  Decreased Interest 0 0 0  Down, Depressed, Hopeless 0 0 0  PHQ - 2 Score 0 0 0   No flowsheet data found.   Current Exercise Habits: Structured exercise class, Type of exercise: walking;calisthenics, Time (Minutes): 60, Frequency (Times/Week): 3, Weekly Exercise (Minutes/Week): 180, Intensity: Intense Exercise limited by: None identified   Immunization History  Administered Date(s) Administered  . Hepatitis A 12/04/2015  . Tdap 12/04/2015    Past Medical History:  Diagnosis Date  . Colon polyp 2015   tubular adenoma, sessile serrated adenoma  . Elevated fasting glucose   . Genital herpes   . Hyperlipidemia   . Hypothyroid   . Mitral regurgitation 2011   trace  . Murmur 2011  . Sacroiliitis ([place]) 2010  . Vitamin D deficiency    No Known  Allergies Past Surgical History:  Procedure Laterality Date  . COLONOSCOPY W/ POLYPECTOMY  2015   tubular adenoma, sessile serrated adenoma - 3 year recall per records.   . TUBAL LIGATION     Family History  Problem Relation Age of Onset  . Hyperlipidemia Mother   . Hypertension Mother   . Diabetes Father 1       IDDM at age 35  . Alcohol abuse Maternal Grandfather   . Diabetes Brother   . Breast cancer Neg Hx   . Colon cancer Neg Hx    Social History   Socioeconomic History  . Marital status: Married    Spouse name: Not on file  . Number of children: Not on file  . Years of education: Not on file  . Highest education level: Not on file  Occupational History  . Not on file  Social Needs  . Financial resource strain: Not on file  . Food insecurity:    Worry: Not on file    Inability: Not on file  . Transportation needs:    Medical: Not on file    Non-medical: Not on file  Tobacco Use  . Smoking status: Never Smoker  . Smokeless tobacco: Never Used  Substance and Sexual Activity  . Alcohol use: Yes    Alcohol/week: 1.2 - 1.8 oz    Types: 2 - 3 Shots of liquor per week  . Drug use: Yes    Frequency: 2.0 times per week    Types: Marijuana  . Sexual activity: Yes    Partners: Male  Birth control/protection: Surgical    Comment: married  Lifestyle  . Physical activity:    Days per week: Not on file    Minutes per session: Not on file  . Stress: Not on file  Relationships  . Social connections:    Talks on phone: Not on file    Gets together: Not on file    Attends religious service: Not on file    Active member of club or organization: Not on file    Attends meetings of clubs or organizations: Not on file    Relationship status: Not on file  . Intimate partner violence:    Fear of current or ex partner: Not on file    Emotionally abused: Not on file    Physically abused: Not on file    Forced sexual activity: Not on file  Other Topics Concern  . Not  on file  Social History Narrative   Married to [place]. 3 children Sabrina Allison   Master's Degree in teaching.    Drinks caffeine.    Takes a daily vitamin   Wears her seatbelt, bicycle helmet, smoke detector in the home.    Exercises routinely   Feels safe in her relationships.    Allergies as of 01/09/2018   No Known Allergies     Medication List        Accurate as of 01/09/18  9:09 AM. Always use your most recent med list.          valACYclovir 500 MG tablet Commonly known as:  VALTREX Take 1 tablet (500 mg total) by mouth daily.       All past medical history, surgical history, allergies, family history, immunizations andmedications were updated in the EMR today and reviewed under the history and medication portions of their EMR.     No results found for this or any previous visit (from the past 2160 hour(s)).  Mm Digital Screening Bilateral  Result Date: 12/14/2016 CLINICAL DATA:  Screening. EXAM: DIGITAL SCREENING BILATERAL MAMMOGRAM WITH CAD COMPARISON:  Previous exam(s). ACR Breast Density Category b: There are scattered areas of fibroglandular density. FINDINGS: There are no findings suspicious for malignancy. Images were processed with CAD. IMPRESSION: No mammographic evidence of malignancy. A result letter of this screening mammogram will be mailed directly to the patient. RECOMMENDATION: Screening mammogram in one year. (Code:SM-B-01Y) BI-RADS CATEGORY  1: Negative. Electronically Signed   By: Franki Cabot M.D.   On: 12/14/2016 12:59     ROS: 14 pt review of systems performed and negative (unless mentioned in an HPI)  Objective: BP 129/84 (BP Location: Right Arm, Patient Position: Sitting, Cuff Size: Normal)   Pulse 63   Temp 98.1 F (36.7 C)   Resp 20   Ht _0  (1.6 m)   Wt 132 lb (59.9 kg)   SpO2 97%   BMI 23.38 kg/m  Gen: Afebrile. No acute distress. Nontoxic in appearance, well-developed, well-nourished,  Pleasant caucasian female.  HENT:  AT. [place]. Bilateral TM visualized and normal in appearance, normal external auditory canal. MMM, no oral lesions, adequate dentition. Bilateral nares within normal limits. Throat without erythema, ulcerations or exudates. no Cough on exam, no hoarseness on exam. Eyes:Pupils Equal Round Reactive to light, Extraocular movements intact,  Conjunctiva without redness, discharge or icterus. Neck/lymp/endocrine: Supple,no lymphadenopathy, no thyromegaly CV: RRR no murmur, noedema, +2/4 P posterior tibialis pulses. no carotid bruits. No JVD. Chest: CTAB, no wheeze, rhonchi or crackles. normal Respiratory effort. good Air movement. Abd: Soft.  flat. NTND. BS present. no Masses palpated. No hepatosplenomegaly. No rebound tenderness or guarding. Skin: no rashes, purpura or petechiae. Warm and well-perfused. Skin intact. Neuro/Msk:  Normal gait. PERLA. EOMi. Alert. Oriented x3.  Cranial nerves II through XII intact. Muscle strength 5/5 upper/lower extremity. DTRs equal bilaterally. Psych: Normal affect, dress and demeanor. Normal speech. Normal thought content and judgment.  No exam data present  Assessment/plan: Taisa Deloria is a 57 y.o. female present for CPE.   hyperlipidemia - Comp Met (CMET) - Lipid panel - TSH Screening for diabetes mellitus - HgB A1c Encounter for long-term (current) use of medications - Comp Met (CMET) Elevated TSH - TSH Screening for deficiency anemia - CBC w/Diff Genital herpes simplex, unspecified site Refilled valtrex Hx of colonic polyps Est. W/ Carlean Purl, followup due Encounter for gynecological examination without abnormal finding Establish for routine gyn care. Request female provider.  - Ambulatory referral to Gynecology Encounter for preventive health examination Patient was encouraged to exercise greater than 150 minutes a week. Patient was encouraged to choose a diet filled with fresh fruits and vegetables, and lean meats. AVS provided to patient today for  education/recommendation on gender specific health and safety maintenance. Colonoscopy: completed 01/12/2017, polyps, 5 yr f/u, Dr. Carlean Purl Mammogram: completed 12/07/2016, breast center.[facility], desires every 2 yr screen. Referred to GYN Cervical cancer screening: last pap: 2017, results: normal, referred to gyn Immunizations: tdap 2017 UTD, Influenza (encouraged yearly), shingrix  #1 provided today Infectious disease screening: HIV and Hep C Declined today. DEXA: N/A Return in about 1 year (around 01/10/2019) for CPE. 2-6 mos nurse visit for shingrix #2   * she is planning a trip to [place] in December, advised her to check CDC website on travel to [place] and make certain she has all recommended vaccines--> health Department should be able to provide. Or, can refer to [facility] if needed.    Electronically signed by:  Howard Pouch, DO [facility]

## 2018-01-09 NOTE — Patient Instructions (Signed)

## 2018-01-09 NOTE — Addendum Note (Signed)
Addended by: Leota Jacobsen on: 01/09/2018 03:48 PM   Modules accepted: Orders

## 2018-01-10 ENCOUNTER — Telehealth: Payer: Self-pay | Admitting: Family Medicine

## 2018-01-10 NOTE — Telephone Encounter (Signed)
Left detailed message with results and instructions on patient voice mail per DPR 

## 2018-01-10 NOTE — Telephone Encounter (Signed)
Please inform patient the following information: Her labs look good with the following exceptions:  - her cholesterol appears high, but her good cholesterol is amazingly high, which is good, and the cause of her elevate reading. Her bad (LDL) is 144- which is about the same as last year--> this is ok, below 130 and closer to 100 better. Increase exercise and fiber/decrease saturated fats will help lower- no meds needed.   - her blood cells are mildly enlarged. This can be seen in a many conditions including vitamin B deficiencies, thyroid disorder, malignancies or even an increase in consumption of alcohol. -Her thyroid test was also just barely abnormal as well. -Recommendations: Start an OTC B complex vitamin daily.  If she consumes alcohol, avoid daily use and more than 1-2 drinks when consuming.  Follow-up in 3 months with provider and we will discuss, and repeat labs.  If she would like she can have her Shingrix No. 2 shot on that day as well (save her from an extra trip).

## 2018-01-11 ENCOUNTER — Encounter: Payer: Self-pay | Admitting: Family Medicine

## 2018-04-11 ENCOUNTER — Ambulatory Visit: Payer: Managed Care, Other (non HMO)

## 2018-04-13 ENCOUNTER — Encounter: Payer: Self-pay | Admitting: Family Medicine

## 2018-04-13 ENCOUNTER — Ambulatory Visit: Payer: Managed Care, Other (non HMO) | Admitting: Family Medicine

## 2018-04-13 VITALS — BP 138/88 | HR 61 | Temp 97.8°F | Resp 20 | Ht 63.0 in | Wt 132.5 lb

## 2018-04-13 DIAGNOSIS — R7989 Other specified abnormal findings of blood chemistry: Secondary | ICD-10-CM | POA: Diagnosis not present

## 2018-04-13 DIAGNOSIS — Z23 Encounter for immunization: Secondary | ICD-10-CM

## 2018-04-13 DIAGNOSIS — D7589 Other specified diseases of blood and blood-forming organs: Secondary | ICD-10-CM | POA: Diagnosis not present

## 2018-04-13 DIAGNOSIS — Z79899 Other long term (current) drug therapy: Secondary | ICD-10-CM | POA: Diagnosis not present

## 2018-04-13 LAB — CBC WITH DIFFERENTIAL/PLATELET
Basophils Absolute: 0 10*3/uL (ref 0.0–0.1)
Basophils Relative: 0.5 % (ref 0.0–3.0)
EOS PCT: 0.9 % (ref 0.0–5.0)
Eosinophils Absolute: 0 10*3/uL (ref 0.0–0.7)
HEMATOCRIT: 40.9 % (ref 36.0–46.0)
Hemoglobin: 14 g/dL (ref 12.0–15.0)
Lymphocytes Relative: 28.5 % (ref 12.0–46.0)
Lymphs Abs: 1.1 10*3/uL (ref 0.7–4.0)
MCHC: 34.2 g/dL (ref 30.0–36.0)
MCV: 100.3 fl — AB (ref 78.0–100.0)
MONOS PCT: 8.6 % (ref 3.0–12.0)
Monocytes Absolute: 0.3 10*3/uL (ref 0.1–1.0)
NEUTROS ABS: 2.3 10*3/uL (ref 1.4–7.7)
Neutrophils Relative %: 61.5 % (ref 43.0–77.0)
PLATELETS: 217 10*3/uL (ref 150.0–400.0)
RBC: 4.08 Mil/uL (ref 3.87–5.11)
RDW: 13 % (ref 11.5–15.5)
WBC: 3.7 10*3/uL — ABNORMAL LOW (ref 4.0–10.5)

## 2018-04-13 LAB — VITAMIN B12: Vitamin B-12: 551 pg/mL (ref 211–911)

## 2018-04-13 LAB — TSH: TSH: 4.39 u[IU]/mL (ref 0.35–4.50)

## 2018-04-13 LAB — T4, FREE: Free T4: 0.62 ng/dL (ref 0.60–1.60)

## 2018-04-13 NOTE — Progress Notes (Signed)
Tailor Lucking , 11-23-1960, 57 y.o., female MRN: [id number] Patient Care Team    Relationship Specialty Notifications Start End  Ma Hillock, DO PCP - General Family Medicine  03/29/16     Chief Complaint  Patient presents with  . Follow-up     Subjective: Pt presents for an OV follow up to abnormal lab work.  Elevated TSH/Macrocytosis without anemia/Abnormal CBC Pt was found to elevated tsh, mcv and low WBC on routine lab work 3 months ago. She has started B complex. Refrained from alcohol consumption. She has been asymptomatic. She reports elevated TSH in the past that returned to normal.   Immunization due shingrix #2 due today   Depression screen [facility] 2/9 04/13/2018 01/09/2018 11/25/2016 03/22/2016  Decreased Interest 0 0 0 0  Down, Depressed, Hopeless 0 0 0 0  PHQ - 2 Score 0 0 0 0    No Known Allergies Social History   Tobacco Use  . Smoking status: Never Smoker  . Smokeless tobacco: Never Used  Substance Use Topics  . Alcohol use: Yes    Alcohol/week: 2.0 - 3.0 standard drinks    Types: 2 - 3 Shots of liquor per week   Past Medical History:  Diagnosis Date  . Colon polyp 2015   tubular adenoma, sessile serrated adenoma  . Elevated fasting glucose   . Genital herpes   . Hyperlipidemia   . Hypothyroid   . Mitral regurgitation 2011   trace  . Murmur 2011  . Sacroiliitis ([place]) 2010  . Vitamin D deficiency    Past Surgical History:  Procedure Laterality Date  . COLONOSCOPY W/ POLYPECTOMY  2015   tubular adenoma, sessile serrated adenoma - 3 year recall per records.   . TUBAL LIGATION     Family History  Problem Relation Age of Onset  . Hyperlipidemia Mother   . Hypertension Mother   . Diabetes Father 51       IDDM at age 38  . Alcohol abuse Maternal Grandfather   . Diabetes Brother   . Breast cancer Neg Hx   . Colon cancer Neg Hx    Allergies as of 04/13/2018   No Known Allergies     Medication List        Accurate as of 04/13/18  9:00  AM. Always use your most recent med list.          valACYclovir 500 MG tablet Commonly known as:  VALTREX Take 1 tablet (500 mg total) by mouth daily.       All past medical history, surgical history, allergies, family history, immunizations andmedications were updated in the EMR today and reviewed under the history and medication portions of their EMR.     ROS: Negative, with the exception of above mentioned in HPI   Objective:  BP 138/88 (BP Location: Right Arm, Patient Position: Sitting, Cuff Size: Normal)   Pulse 61   Temp 97.8 F (36.6 C)   Resp 20   Ht 5\' 3"  (1.6 m)   Wt 132 lb 8 oz (60.1 kg)   SpO2 100%   BMI 23.47 kg/m  Body mass index is 23.47 kg/m. Gen: Afebrile. No acute distress. Nontoxic in appearance, well developed, well nourished.  HENT: AT. [place]. MMM, no oral lesions.  Eyes:Pupils Equal Round Reactive to light, Extraocular movements intact,  Conjunctiva without redness, discharge or icterus. Neck/lymp/endocrine: Supple,no lymphadenopathy, no thyromegaly.  CV: RRR  Chest: CTAB, no wheeze or crackles.  No exam data present No  results found. No results found for this or any previous visit (from the past 24 hour(s)).  Assessment/Plan: Jenefer Woerner is a 57 y.o. female present for OV for  Elevated TSH/Macrocytosis without anemia/Abnormal CBC - Taking B-complex. Asymptomatic. Drinks 1-2 drinks a night sometimes.  - TSH - T4, free - Thyroid peroxidase antibody - CBC w/Diff - B12 Encounter for long-term current use of medication - CBC w/Diff - B12 Immunization due - Varicella-zoster vaccine IM  F/U dependent on labs.    Reviewed expectations re: course of current medical issues.  Discussed self-management of symptoms.  Outlined signs and symptoms indicating need for more acute intervention.  Patient verbalized understanding and all questions were answered.  Patient received an After-Visit Summary.    No orders of the defined types were  placed in this encounter.    Note is dictated utilizing voice recognition software. Although note has been proof read prior to signing, occasional typographical errors still can be missed. If any questions arise, please do not hesitate to call for verification.   electronically signed by:  Howard Pouch, DO  [facility]

## 2018-04-13 NOTE — Patient Instructions (Signed)
We will call you with results once results available.  Continue B vitamins.   Shingrix #2 completed today.

## 2018-04-16 ENCOUNTER — Telehealth: Payer: Self-pay | Admitting: Family Medicine

## 2018-04-16 LAB — THYROID PEROXIDASE ANTIBODY: Thyroperoxidase Ab SerPl-aCnc: 81 IU/mL — ABNORMAL HIGH (ref <9)

## 2018-04-16 NOTE — Telephone Encounter (Signed)
Please inform patient the following information: Her b12 was normal >> continue supplement.  Her RBC size is returning to normal at 100.3 Her thyroid test are as follows; the tsh is just barely in normal range and so is the T63free. Her thyroid antibodies are elevated>>> meaning she does not need medications now, as long as she remains asymptomatic. We will continue to follow with CPE yearly. With elevated antibodies she will more than likely, at some point as she ages, require thyroid supplement. Time will tell.

## 2018-04-17 NOTE — Telephone Encounter (Signed)
Left detailed message with results and instructions on patient voice mail per DPR 

## 2018-04-18 ENCOUNTER — Ambulatory Visit: Payer: Managed Care, Other (non HMO) | Admitting: Family Medicine

## 2018-05-12 IMAGING — MG DIGITAL SCREENING BILATERAL MAMMOGRAM WITH CAD
4 series · 4 of 4 positions shown · non-contrast
Comparison: Previous exam(s).

CLINICAL DATA: Screening.

EXAM:
DIGITAL SCREENING BILATERAL MAMMOGRAM WITH CAD

[R MLO]
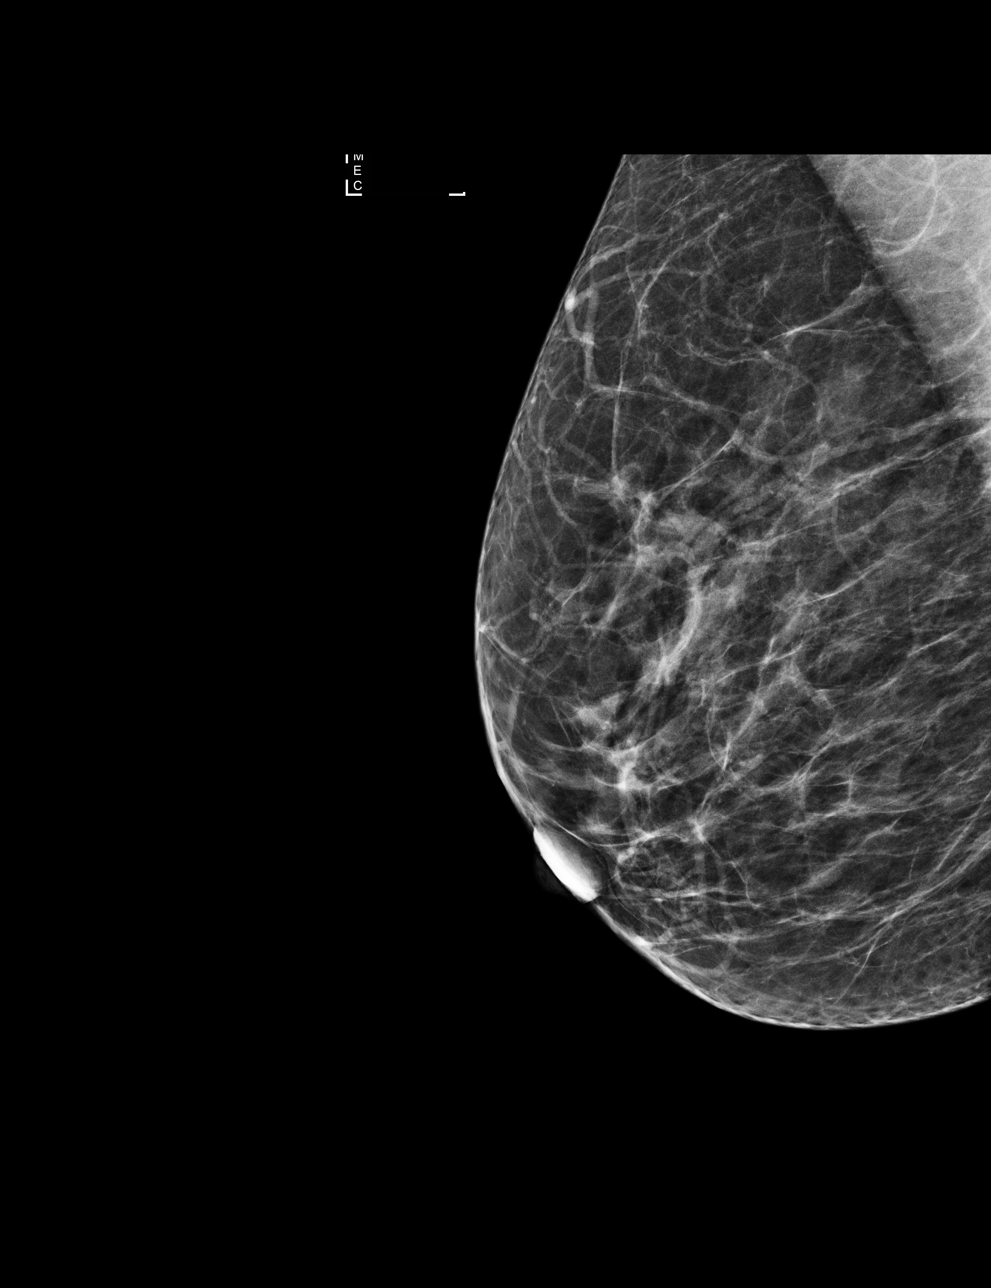

[L MLO]
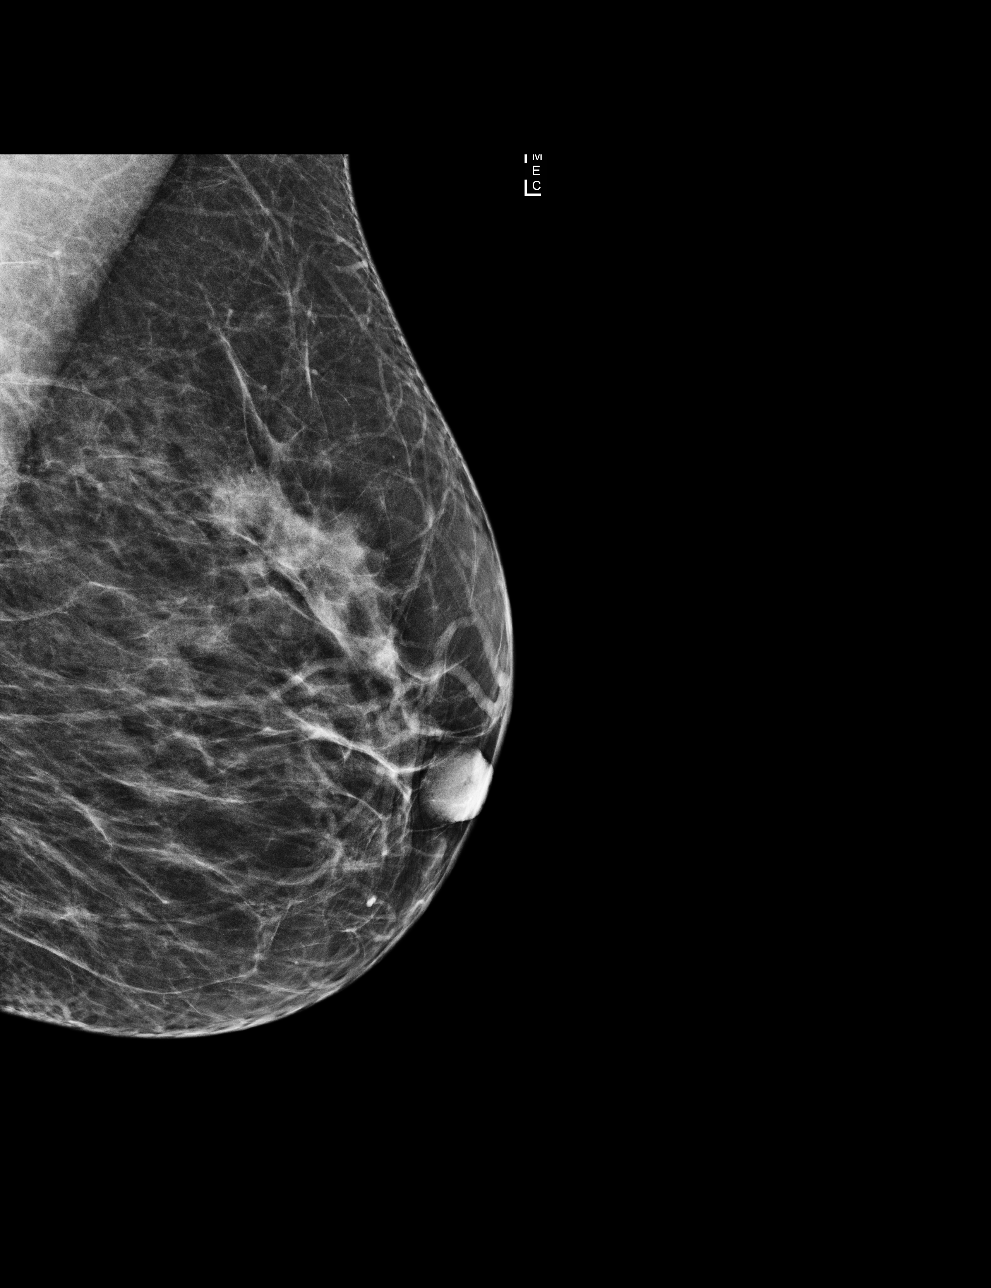

[L CC]
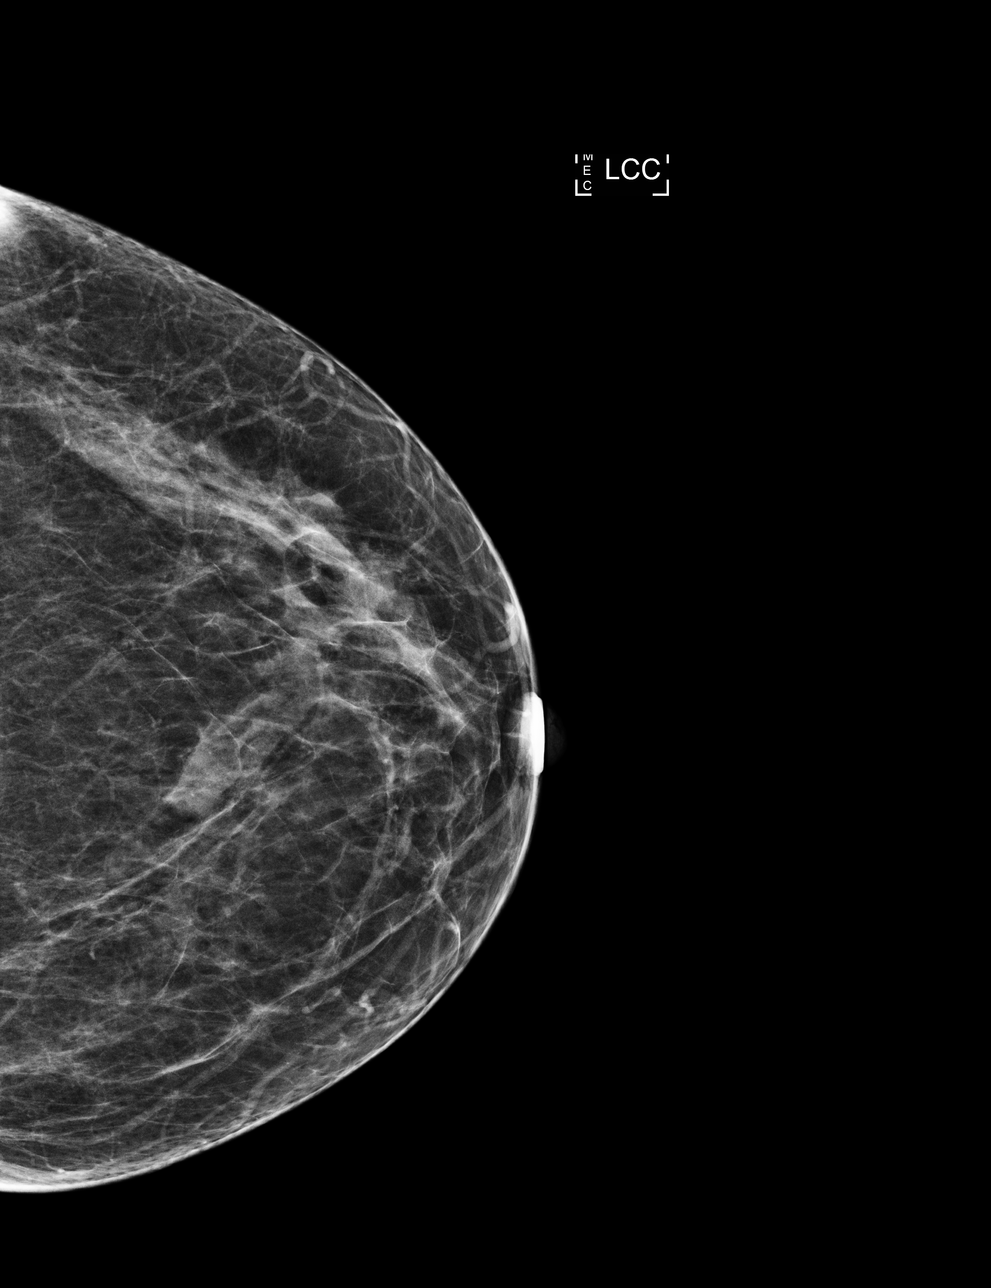

[R CC]
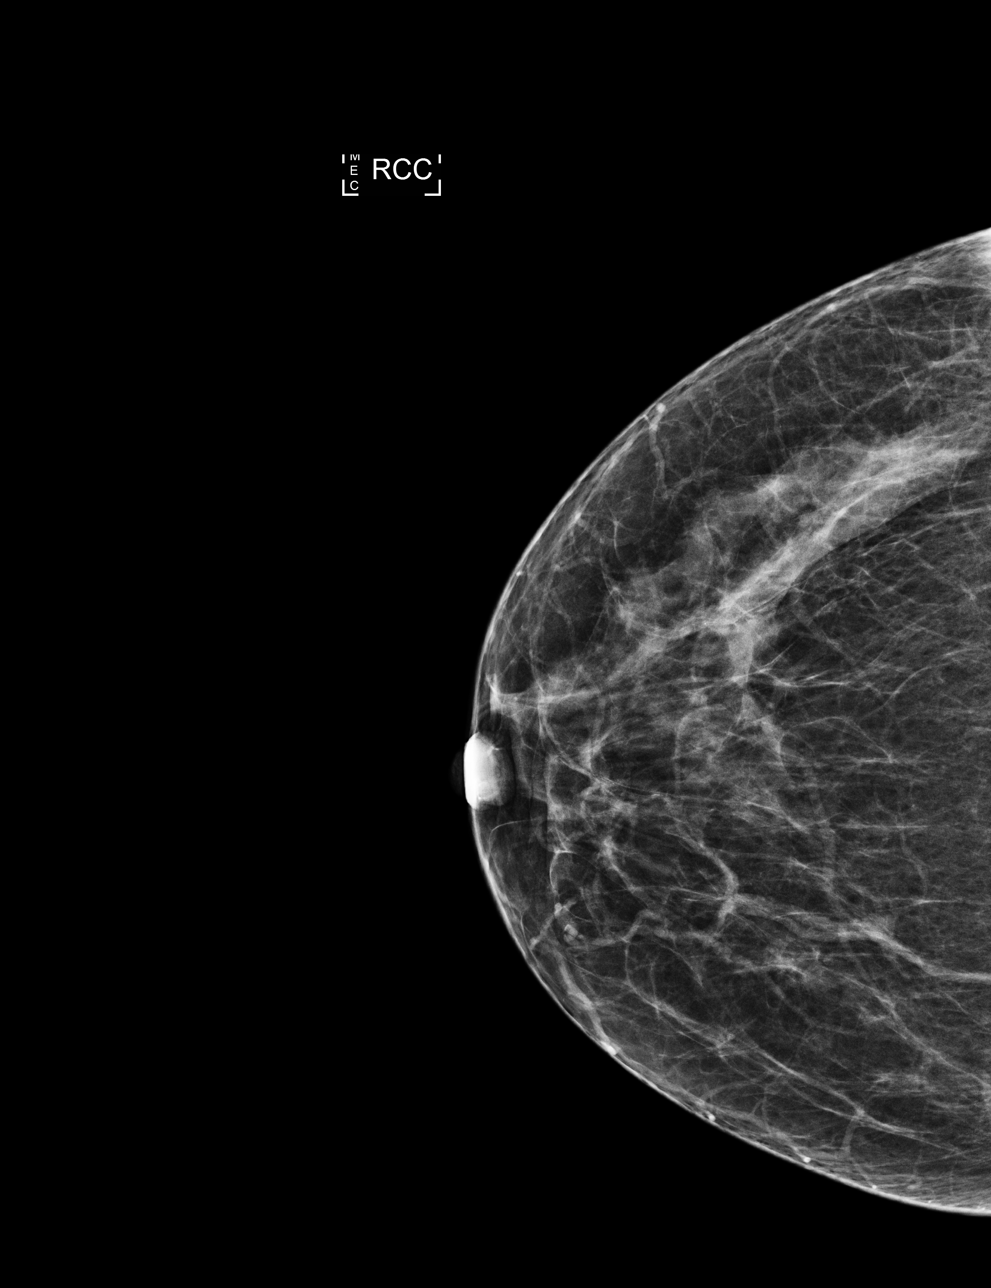

[4 of 4 positions shown; findings below may reference images not displayed]

ACR Breast Density Category b: There are scattered areas of
fibroglandular density.
FINDINGS: There are no findings suspicious for malignancy. Images were
processed with CAD.
IMPRESSION: No mammographic evidence of malignancy. A result letter of this
screening mammogram will be mailed directly to the patient.

RECOMMENDATION:
Screening mammogram in one year. (Code:AS-G-LCT)

BI-RADS CATEGORY  1: Negative.

## 2018-06-08 ENCOUNTER — Ambulatory Visit (INDEPENDENT_AMBULATORY_CARE_PROVIDER_SITE_OTHER): Payer: Managed Care, Other (non HMO) | Admitting: Internal Medicine

## 2018-06-08 DIAGNOSIS — Z7184 Encounter for health counseling related to travel: Secondary | ICD-10-CM

## 2018-06-08 DIAGNOSIS — Z789 Other specified health status: Secondary | ICD-10-CM

## 2018-06-08 DIAGNOSIS — Z9189 Other specified personal risk factors, not elsewhere classified: Secondary | ICD-10-CM

## 2018-06-08 DIAGNOSIS — Z7185 Encounter for immunization safety counseling: Secondary | ICD-10-CM

## 2018-06-08 DIAGNOSIS — Z298 Encounter for other specified prophylactic measures: Secondary | ICD-10-CM

## 2018-06-08 DIAGNOSIS — Z23 Encounter for immunization: Secondary | ICD-10-CM

## 2018-06-08 DIAGNOSIS — Z7189 Other specified counseling: Secondary | ICD-10-CM

## 2018-06-08 MED ORDER — TYPHOID VACCINE PO CPDR: 1.0000 | DELAYED_RELEASE_CAPSULE | ORAL | 0 refills | Status: DC

## 2018-06-08 MED ORDER — AZITHROMYCIN 500 MG PO TABS: 500.0000 mg | ORAL_TABLET | Freq: Every day | ORAL | 0 refills | Status: DC

## 2018-06-08 MED FILL — VIVOTIF EC CAPSULE: 8 days supply | Qty: 4 | Fill #0

## 2018-06-08 MED FILL — AZITHROMYCIN 500 MG TABLET: 500 | 5 days supply | Qty: 5 | Fill #0

## 2018-06-08 NOTE — Progress Notes (Signed)
  Subjective:   Sabrina Allison is a 57 y.o. female who presents to the Infectious Disease clinic for travel consultation. Planned departure date: dec 26th         Planned return date: 8 day trip - 4 days in [place] and [place] Countries of travel: [place] Areas in country: urban and rural Accommodations: hotel and cruise Purpose of travel: vacation Prior travel out of [place]: yes - [place], [place], and [place]     Objective:   Medications: valtrex    Assessment:   No contraindications to travel.    Plan:   Pre travel vaccinations - gave hep A #2 today and oral typhoid vaccine   Traveler's diarrhea = gave precautions and azithromycin to use if needed  Mosquito bite prevention = gave recs for deet and premethrin spraying of clothing. No malarone needed

## 2018-06-08 NOTE — Patient Instructions (Signed)
[facility]                [address], [place]                   [place], [place] [address]                      Phone: [phone]                        Fax: [phone]   Planned departure date: dec 26th        Planned return date: early january Countries of travel: [place]  Guidelines for the Prevention & Treatment of Traveler's Diarrhea  Prevention: "Boil it, Peel it, Lacinda Axon it, or Forget it"   the fewer chances -> lower risk: try to stick to food & water precautions as much as possible"   If it's "piping hot"; it is probably okay, if not, it may not be   Treatment   1) You should always take care to drink lots of fluids in order to avoid dehydration   2) You should bring medications with you in case you come down with a case of diarrhea   3) OTC = bring pepto-bismol - can take with initial abdominal symptoms;                    Imodium - can help slow down your intestinal tract, can help relief cramps                    and diarrhea, can take if no bloody diarrhea  Use azithromycin if needed for traveler's diarrhea  Immunizations received today: oral typhoid, and hep a #2  Prior to travel:  1) Be sure to pick up appropriate prescriptions, including medicine you take daily. Do not expect to be able to fill your prescriptions abroad.  2) Strongly consider obtaining traveler's insurance, including emergency evacuation insurance. Most plans in the [place] do not cover participants abroad. (see below for resources)  3) Register at the appropriate [place] embassy or consulate with travel dates so they are aware of your presence in-country and for helpful advice during travel using the [employer] (STEP, GreenNylon.com.cy).  4) Leave contact information with a relative or friend.  5) Keep a Research officer, political party, credit cards in case they become lost or stolen  6) Inform your credit card company that you  will be travelling abroad   During travel:  1) If you become ill and need medical advice, the [place] [employer]	[place] of the country you are traveling in general provides a list of [place]key[place] speaking doctors.  We are also available on MyChart for remote consultation if you register prior to travel. 2) Avoid motorcycles or scooters when at all possible. Traffic laws in many countries are lax and accidents occur frequently.  3) Do not take any unnecessary risks that you wouldn't do at home.   Resources:  -Country specific information: BlindResource.ca or GreenNylon.com.cy  -Press photographer (DEET, mosquito nets): [employer], [employer] store, [employer], [place] insurance options: gatewayplans.com; http://clayton-rivera.info/; travelguard.com or Good [employer], gninsurance.com or info@gninsurance .com, H1235423.   Post Travel:  If you return from your trip ill, call your primary care doctor or our travel clinic @ [phone].   Enjoy your trip and know that with proper pre-travel preparation, most  people have an enjoyable and uninterrupted trip!

## 2018-07-02 ENCOUNTER — Encounter: Payer: Self-pay | Admitting: Family Medicine

## 2018-12-13 ENCOUNTER — Encounter: Payer: Self-pay | Admitting: Family Medicine

## 2018-12-14 ENCOUNTER — Encounter: Payer: Self-pay | Admitting: Family Medicine

## 2018-12-14 MED ORDER — VALACYCLOVIR HCL 500 MG PO TABS: 500.0000 mg | ORAL_TABLET | Freq: Every day | ORAL | 0 refills | Status: DC

## 2019-01-11 ENCOUNTER — Encounter: Payer: Managed Care, Other (non HMO) | Admitting: Family Medicine

## 2019-01-14 ENCOUNTER — Other Ambulatory Visit: Payer: Self-pay | Admitting: Family Medicine

## 2019-01-14 DIAGNOSIS — Z1231 Encounter for screening mammogram for malignant neoplasm of breast: Secondary | ICD-10-CM

## 2019-01-17 ENCOUNTER — Other Ambulatory Visit: Payer: Self-pay

## 2019-01-17 ENCOUNTER — Ambulatory Visit (INDEPENDENT_AMBULATORY_CARE_PROVIDER_SITE_OTHER): Payer: Managed Care, Other (non HMO)

## 2019-01-17 DIAGNOSIS — Z1231 Encounter for screening mammogram for malignant neoplasm of breast: Secondary | ICD-10-CM

## 2019-01-23 ENCOUNTER — Other Ambulatory Visit: Payer: Self-pay

## 2019-01-23 ENCOUNTER — Other Ambulatory Visit (HOSPITAL_COMMUNITY)
Admission: RE | Admit: 2019-01-23 | Discharge: 2019-01-23 | Disposition: A | Discharge status: Home / Self Care | Payer: Managed Care, Other (non HMO) | Source: Ambulatory Visit | Attending: Family Medicine | Admitting: Family Medicine

## 2019-01-23 ENCOUNTER — Encounter: Payer: Self-pay | Admitting: Family Medicine

## 2019-01-23 ENCOUNTER — Ambulatory Visit (INDEPENDENT_AMBULATORY_CARE_PROVIDER_SITE_OTHER): Payer: Managed Care, Other (non HMO) | Admitting: Family Medicine

## 2019-01-23 VITALS — BP 127/80 | HR 70 | Temp 98.3°F | Resp 17 | Ht 62.0 in | Wt 135.5 lb

## 2019-01-23 DIAGNOSIS — A6000 Herpesviral infection of urogenital system, unspecified: Secondary | ICD-10-CM

## 2019-01-23 DIAGNOSIS — R7989 Other specified abnormal findings of blood chemistry: Secondary | ICD-10-CM | POA: Diagnosis not present

## 2019-01-23 DIAGNOSIS — Z01419 Encounter for gynecological examination (general) (routine) without abnormal findings: Secondary | ICD-10-CM | POA: Diagnosis present

## 2019-01-23 DIAGNOSIS — Z131 Encounter for screening for diabetes mellitus: Secondary | ICD-10-CM

## 2019-01-23 DIAGNOSIS — K635 Polyp of colon: Secondary | ICD-10-CM | POA: Diagnosis not present

## 2019-01-23 DIAGNOSIS — Z130 Encounter for screening for diseases of the blood and blood-forming organs and certain disorders involving the immune mechanism: Secondary | ICD-10-CM | POA: Diagnosis not present

## 2019-01-23 DIAGNOSIS — Z0000 Encounter for general adult medical examination without abnormal findings: Secondary | ICD-10-CM | POA: Diagnosis not present

## 2019-01-23 DIAGNOSIS — H6121 Impacted cerumen, right ear: Secondary | ICD-10-CM

## 2019-01-23 DIAGNOSIS — E7849 Other hyperlipidemia: Secondary | ICD-10-CM | POA: Diagnosis not present

## 2019-01-23 LAB — HM PAP SMEAR

## 2019-01-23 MED ORDER — VALACYCLOVIR HCL 500 MG PO TABS: 500.0000 mg | ORAL_TABLET | Freq: Every day | ORAL | 3 refills | Status: DC

## 2019-01-23 MED ORDER — DEBROX 6.5 % OT SOLN: 5.0000 [drp] | Freq: Every day | OTIC | 0 refills | Status: DC

## 2019-01-23 NOTE — Patient Instructions (Signed)
Health Maintenance, Female Adopting a healthy lifestyle and getting preventive care are important in promoting health and wellness. Ask your health care provider about:  The right schedule for you to have regular tests and exams.  Things you can do on your own to prevent diseases and keep yourself healthy. What should I know about diet, weight, and exercise? Eat a healthy diet   Eat a diet that includes plenty of vegetables, fruits, low-fat dairy products, and lean protein.  Do not eat a lot of foods that are high in solid fats, added sugars, or sodium. Maintain a healthy weight Body mass index (BMI) is used to identify weight problems. It estimates body fat based on height and weight. Your health care provider can help determine your BMI and help you achieve or maintain a healthy weight. Get regular exercise Get regular exercise. This is one of the most important things you can do for your health. Most adults should:  Exercise for at least 150 minutes each week. The exercise should increase your heart rate and make you sweat (moderate-intensity exercise).  Do strengthening exercises at least twice a week. This is in addition to the moderate-intensity exercise.  Spend less time sitting. Even light physical activity can be beneficial. Watch cholesterol and blood lipids Have your blood tested for lipids and cholesterol at 57 years of age, then have this test every 5 years. Have your cholesterol levels checked more often if:  Your lipid or cholesterol levels are high.  You are older than 57 years of age.  You are at high risk for heart disease. What should I know about cancer screening? Depending on your health history and family history, you may need to have cancer screening at various ages. This may include screening for:  Breast cancer.  Cervical cancer.  Colorectal cancer.  Skin cancer.  Lung cancer. What should I know about heart disease, diabetes, and high blood  pressure? Blood pressure and heart disease  High blood pressure causes heart disease and increases the risk of stroke. This is more likely to develop in people who have high blood pressure readings, are of African descent, or are overweight.  Have your blood pressure checked: ? Every 3-5 years if you are 18-39 years of age. ? Every year if you are 40 years old or older. Diabetes Have regular diabetes screenings. This checks your fasting blood sugar level. Have the screening done:  Once every three years after age 40 if you are at a normal weight and have a low risk for diabetes.  More often and at a younger age if you are overweight or have a high risk for diabetes. What should I know about preventing infection? Hepatitis B If you have a higher risk for hepatitis B, you should be screened for this virus. Talk with your health care provider to find out if you are at risk for hepatitis B infection. Hepatitis C Testing is recommended for:  Everyone born from 1945 through 1965.  Anyone with known risk factors for hepatitis C. Sexually transmitted infections (STIs)  Get screened for STIs, including gonorrhea and chlamydia, if: ? You are sexually active and are younger than 57 years of age. ? You are older than 57 years of age and your health care provider tells you that you are at risk for this type of infection. ? Your sexual activity has changed since you were last screened, and you are at increased risk for chlamydia or gonorrhea. Ask your health care provider if   you are at risk.  Ask your health care provider about whether you are at high risk for HIV. Your health care provider may recommend a prescription medicine to help prevent HIV infection. If you choose to take medicine to prevent HIV, you should first get tested for HIV. You should then be tested every 3 months for as long as you are taking the medicine. Pregnancy  If you are about to stop having your period (premenopausal) and  you may become pregnant, seek counseling before you get pregnant.  Take 400 to 800 micrograms (mcg) of folic acid every day if you become pregnant.  Ask for birth control (contraception) if you want to prevent pregnancy. Osteoporosis and menopause Osteoporosis is a disease in which the bones lose minerals and strength with aging. This can result in bone fractures. If you are 65 years old or older, or if you are at risk for osteoporosis and fractures, ask your health care provider if you should:  Be screened for bone loss.  Take a calcium or vitamin D supplement to lower your risk of fractures.  Be given hormone replacement therapy (HRT) to treat symptoms of menopause. Follow these instructions at home: Lifestyle  Do not use any products that contain nicotine or tobacco, such as cigarettes, e-cigarettes, and chewing tobacco. If you need help quitting, ask your health care provider.  Do not use street drugs.  Do not share needles.  Ask your health care provider for help if you need support or information about quitting drugs. Alcohol use  Do not drink alcohol if: ? Your health care provider tells you not to drink. ? You are pregnant, may be pregnant, or are planning to become pregnant.  If you drink alcohol: ? Limit how much you use to 0-1 drink a day. ? Limit intake if you are breastfeeding.  Be aware of how much alcohol is in your drink. In the [place] one drink equals one 12 oz bottle of beer (355 mL), one 5 oz glass of wine (148 mL), or one 1 oz glass of hard liquor (44 mL). General instructions  Schedule regular health, dental, and eye exams.  Stay current with your vaccines.  Tell your health care provider if: ? You often feel depressed. ? You have ever been abused or do not feel safe at home. Summary  Adopting a healthy lifestyle and getting preventive care are important in promoting health and wellness.  Follow your health care provider's instructions about healthy  diet, exercising, and getting tested or screened for diseases.  Follow your health care provider's instructions on monitoring your cholesterol and blood pressure. This information is not intended to replace advice given to you by your health care provider. Make sure you discuss any questions you have with your health care provider. Document Released: 12/27/2010 Document Revised: 06/06/2018 Document Reviewed: 06/06/2018 Elsevier Patient Education  2020 Elsevier Inc.  

## 2019-01-23 NOTE — Progress Notes (Signed)
Patient ID: Sabrina Allison, female  DOB: [birth year], 58 y.o.   MRN: [id number] Patient Care Team    Relationship Specialty Notifications Start End  Ma Hillock, DO PCP - General Family Medicine  03/29/16     Chief Complaint  Patient presents with  . Annual Exam    with pap. not fasting. mammo 723/20 colonoscopy 12/2016    Subjective:  Sabrina Allison is a 58 y.o.  Female  present for CPE w/ pap. All past medical history, surgical history, allergies, family history, immunizations, medications and social history were updated in the electronic medical record today. All recent labs, ED visits and hospitalizations within the last year were reviewed.  Elevated TSH Has had elevated TSH and Positive AB test. Has been stable without medications.  Health maintenance:  Colonoscopy: completed 01/12/2017, polyps, 5 yr f/u, Dr. Carlean Purl Mammogram: completed 01/17/2019. breast center.[facility], desires every 2 yr screen. Cervical cancer screening: last pap: 2017, results: normal, referred to gyn Immunizations: tdap 2017 UTD, Influenza UTD 2019 (encouraged yearly), shingrix series completed.  Infectious disease screening: HIV and Hep CDeclined  DEXA: N/A Assistive device: none Oxygen DXA:JOIN Patient has a Dental home. Hospitalizations/ED visits: reviewed   Depression screen [facility] 2/9 01/23/2019 04/13/2018 01/09/2018 11/25/2016 03/22/2016  Decreased Interest 0 0 0 0 0  Down, Depressed, Hopeless 0 0 0 0 0  PHQ - 2 Score 0 0 0 0 0   No flowsheet data found.   Immunization History  Administered Date(s) Administered  . Hepatitis A 12/04/2015  . Hepatitis A, Adult 06/08/2018  . Tdap 12/04/2015  . Typhoid Live 06/08/2018  . Zoster Recombinat (Shingrix) 01/09/2018, 04/13/2018    Past Medical History:  Diagnosis Date  . Colon polyp 2015   tubular adenoma, sessile serrated adenoma  . Elevated fasting glucose   . Genital herpes   . Hyperlipidemia   . Hypothyroid   . Mitral regurgitation 2011    trace  . Murmur 2011  . Sacroiliitis ([place]) 2010  . Vitamin D deficiency    No Known Allergies Past Surgical History:  Procedure Laterality Date  . COLONOSCOPY W/ POLYPECTOMY  2015   tubular adenoma, sessile serrated adenoma - 3 year recall per records.   . TUBAL LIGATION     Family History  Problem Relation Age of Onset  . Hyperlipidemia Mother   . Hypertension Mother   . Diabetes Father 80       IDDM at age 69  . Alcohol abuse Maternal Grandfather   . Diabetes Brother   . Breast cancer Neg Hx   . Colon cancer Neg Hx    Social History   Social History Narrative   Married to [employer]. 3 children Sabrina Allison   Master's Degree in teaching.    Drinks caffeine.    Takes a daily vitamin   Wears her seatbelt, bicycle helmet, smoke detector in the home.    Exercises routinely   Feels safe in her relationships.     Allergies as of 01/23/2019   No Known Allergies     Medication List       Accurate as of January 23, 2019  2:32 PM. If you have any questions, ask your nurse or doctor.        STOP taking these medications   azithromycin 500 MG tablet Commonly known as: ZITHROMAX Stopped by: Howard Pouch, DO   typhoid DR capsule Commonly known as: VIVOTIF Stopped by: Howard Pouch, DO     TAKE these medications  Debrox 6.5 % OTIC solution Generic drug: carbamide peroxide Place 5 drops into the right ear daily. Started by: Howard Pouch, DO   valACYclovir 500 MG tablet Commonly known as: VALTREX Take 1 tablet (500 mg total) by mouth daily.       All past medical history, surgical history, allergies, family history, immunizations andmedications were updated in the EMR today and reviewed under the history and medication portions of their EMR.     No results found for this or any previous visit (from the past 2160 hour(s)).  Mm Digital Screening Bilateral  Result Date: 12/14/2016 CLINICAL DATA:  Screening. EXAM: DIGITAL SCREENING BILATERAL MAMMOGRAM WITH  CAD COMPARISON:  Previous exam(s). ACR Breast Density Category b: There are scattered areas of fibroglandular density. FINDINGS: There are no findings suspicious for malignancy. Images were processed with CAD. IMPRESSION: No mammographic evidence of malignancy. A result letter of this screening mammogram will be mailed directly to the patient. RECOMMENDATION: Screening mammogram in one year. (Code:SM-B-01Y) BI-RADS CATEGORY  1: Negative. Electronically Signed   By: Franki Cabot M.D.   On: 12/14/2016 12:59   ROS: 14 pt review of systems performed and negative (unless mentioned in an HPI)  Objective: BP 127/80 (BP Location: Left Arm, Patient Position: Sitting, Cuff Size: Normal)   Pulse 70   Temp 98.3 F (36.8 C) (Temporal)   Resp 17   Ht 5\' 2"  (1.575 m)   Wt 135 lb 8 oz (61.5 kg)   SpO2 97%   BMI 24.78 kg/m  Gen: Afebrile. No acute distress. Nontoxic in appearance, well-developed, well-nourished, pleasant, Caucasian female HENT: AT. [place].  Right TM visualized and normal in appearance, left TM unable to be visualized secondary to cerumen.  Normal external auditory canal. MMM, no oral lesions, adequate dentition. Bilateral nares within normal limits. Throat without erythema, ulcerations or exudates.  No cough on exam, no hoarseness on exam. Eyes:Pupils Equal Round Reactive to light, Extraocular movements intact,  Conjunctiva without redness, discharge or icterus. Neck/lymp/endocrine: Supple, no lymphadenopathy, no thyromegaly CV: RRR no murmur, no edema, +2/4 P posterior tibialis pulses.  No carotid bruits. No JVD. Chest: CTAB, no wheeze, rhonchi or crackles.  Normal respiratory effort.  Good air movement. Abd: Soft.  Flat. NTND. BS present.  No masses palpated. No hepatosplenomegaly. No rebound tenderness or guarding. Skin: No rashes, purpura or petechiae. Warm and well-perfused. Skin intact. Neuro/Msk:  Normal gait. PERLA. EOMi. Alert. Oriented x3.  Cranial nerves II through XII intact. Muscle  strength 5/5 upper/lower extremity. DTRs equal bilaterally. Psych: Normal affect, dress and demeanor. Normal speech. Normal thought content and judgment. Breasts: breasts appear normal, symmetrical, no tenderness on exam, no suspicious masses, no skin or nipple changes or axillary nodes. GYN:  External genitalia within normal limits, normal hair distribution, no lesions. Urethral meatus normal, no lesions. Vaginal mucosa pink, moist, normal rugae, no lesions. No cystocele or rectocele. cervix without lesions, no discharge. Bimanual exam revealed normal uterus.  No bladder/suprapubic fullness, masses or tenderness. No cervical motion tenderness. No adnexal fullness. Anus and perineum within normal limits, no lesions.  No exam data present  Assessment/plan: Darcel Zick is a 58 y.o. female present for CPE Elevated TSH - TSH Genital herpes simplex, unspecified site - valACYclovir (VALTREX) 500 MG tablet; Take 1 tablet (500 mg total) by mouth daily.  Dispense: 90 tablet; Refill: 3 Polyp of colon, unspecified part of colon, unspecified type Due 2023 for follow up Other hyperlipidemia - Comprehensive metabolic panel - Lipid panel Screening for deficiency  anemia - CBC with Differential/Platelet Diabetes mellitus screening - Hemoglobin A1c Encounter for routine gynecological examination with Papanicolaou smear of cervix - Cytology - PAP with co-test Cerumen right: - debrox a few times a week until cleared.Avoid Q-tips  Encounter for preventive health examination Patient was encouraged to exercise greater than 150 minutes a week. Patient was encouraged to choose a diet filled with fresh fruits and vegetables, and lean meats. AVS provided to patient today for education/recommendation on gender specific health and safety maintenance. Colonoscopy: completed 01/12/2017, polyps, 5 yr f/u, Dr. Carlean Purl Mammogram: completed 01/17/2019. breast center.[facility], desires every 2 yr screen. Cervical cancer  screening: last pap: 2017, results: normal, referred to gyn Immunizations: tdap 2017 UTD, Influenza UTD 2019 (encouraged yearly), shingrix series completed.  Infectious disease screening: HIV and Hep CDeclined  DEXA: N/A    Return in about 1 year (around 01/23/2020) for CPE (30 min).  Electronically signed by: Howard Pouch, DO [place]

## 2019-01-24 LAB — CBC WITH DIFFERENTIAL/PLATELET
Basophils Absolute: 0.1 10*3/uL (ref 0.0–0.1)
Basophils Relative: 0.9 % (ref 0.0–3.0)
Eosinophils Absolute: 0 10*3/uL (ref 0.0–0.7)
Eosinophils Relative: 0.7 % (ref 0.0–5.0)
HCT: 40.7 % (ref 36.0–46.0)
Hemoglobin: 13.6 g/dL (ref 12.0–15.0)
Lymphocytes Relative: 26.1 % (ref 12.0–46.0)
Lymphs Abs: 1.5 10*3/uL (ref 0.7–4.0)
MCHC: 33.3 g/dL (ref 30.0–36.0)
MCV: 101.3 fl — ABNORMAL HIGH (ref 78.0–100.0)
Monocytes Absolute: 0.4 10*3/uL (ref 0.1–1.0)
Monocytes Relative: 7.8 % (ref 3.0–12.0)
Neutro Abs: 3.7 10*3/uL (ref 1.4–7.7)
Neutrophils Relative %: 64.5 % (ref 43.0–77.0)
Platelets: 233 10*3/uL (ref 150.0–400.0)
RBC: 4.02 Mil/uL (ref 3.87–5.11)
RDW: 13.2 % (ref 11.5–15.5)
WBC: 5.7 10*3/uL (ref 4.0–10.5)

## 2019-01-24 LAB — COMPREHENSIVE METABOLIC PANEL
ALT: 22 U/L (ref 0–35)
AST: 26 U/L (ref 0–37)
Albumin: 5 g/dL (ref 3.5–5.2)
Alkaline Phosphatase: 44 U/L (ref 39–117)
BUN: 25 mg/dL — ABNORMAL HIGH (ref 6–23)
CO2: 28 mEq/L (ref 19–32)
Calcium: 10.1 mg/dL (ref 8.4–10.5)
Chloride: 100 mEq/L (ref 96–112)
Creatinine, Ser: 0.69 mg/dL (ref 0.40–1.20)
GFR: 87.6 mL/min (ref >60.00)
Glucose, Bld: 98 mg/dL (ref 70–99)
Potassium: 4.3 mEq/L (ref 3.5–5.1)
Sodium: 138 mEq/L (ref 135–145)
Total Bilirubin: 0.4 mg/dL (ref 0.2–1.2)
Total Protein: 7.4 g/dL (ref 6.0–8.3)

## 2019-01-24 LAB — LIPID PANEL
Cholesterol: 277 mg/dL — ABNORMAL HIGH (ref 0–200)
HDL: 114.6 mg/dL (ref >39.00)
LDL Cholesterol: 139 mg/dL — ABNORMAL HIGH (ref 0–99)
NonHDL: 162.31
Total CHOL/HDL Ratio: 2
Triglycerides: 117 mg/dL (ref 0.0–149.0)
VLDL: 23.4 mg/dL (ref 0.0–40.0)

## 2019-01-24 LAB — TSH: TSH: 4.7 u[IU]/mL — ABNORMAL HIGH (ref 0.35–4.50)

## 2019-01-24 LAB — T4, FREE: Free T4: 0.66 ng/dL (ref 0.60–1.60)

## 2019-01-24 LAB — HEMOGLOBIN A1C: Hgb A1c MFr Bld: 5.4 % (ref 4.6–6.5)

## 2019-01-28 ENCOUNTER — Telehealth: Payer: Self-pay | Admitting: Family Medicine

## 2019-01-28 ENCOUNTER — Encounter: Payer: Self-pay | Admitting: Family Medicine

## 2019-01-28 DIAGNOSIS — R87619 Unspecified abnormal cytological findings in specimens from cervix uteri: Secondary | ICD-10-CM

## 2019-01-28 LAB — CYTOLOGY - PAP: HPV: NOT DETECTED

## 2019-01-28 NOTE — Telephone Encounter (Addendum)
Please inform patient the following information: -Her labs are all stable from prior collections.  Her thyroid/TSH is still very mildly elevated, but the T4 is still within normal range.  She has not desired medication start in the past.  And I would recommend we just continue to follow since it is remaining stable. -Her cholesterol appears elevated with a total cholesterol 277 however it is secondary to her good cholesterol/HDL being so high.  This is elevated for good reason and not concerning.  Her Pap returned with atypical cells, which could be secondary to the atrophic changes that was found also.  Which means abnormal cells that could be from postmenopausal changes.  However, to be safe I feel she should follow-up with gynecology.  Most of the time these changes are secondary to the postmenopausal changes-  However, sometimes they can be from early cancer cells.    -Referral to gynecology.

## 2019-01-28 NOTE — Telephone Encounter (Signed)
Pt was called and message was left to return call  

## 2019-01-29 ENCOUNTER — Encounter: Payer: Self-pay | Admitting: Family Medicine

## 2019-01-29 NOTE — Telephone Encounter (Signed)
Pt was called and given results. She verbalized understanding  

## 2019-02-11 ENCOUNTER — Encounter: Payer: Self-pay | Admitting: Family Medicine

## 2019-02-11 ENCOUNTER — Other Ambulatory Visit: Payer: Self-pay

## 2019-02-11 ENCOUNTER — Encounter: Payer: Self-pay | Admitting: Physician Assistant

## 2019-02-11 ENCOUNTER — Ambulatory Visit: Payer: Managed Care, Other (non HMO) | Admitting: Physician Assistant

## 2019-02-11 VITALS — BP 122/82 | HR 72 | Temp 97.9°F | Resp 16 | Wt 136.0 lb

## 2019-02-11 DIAGNOSIS — H6121 Impacted cerumen, right ear: Secondary | ICD-10-CM

## 2019-02-11 NOTE — Telephone Encounter (Signed)
Looks like she is fine seeing someone anytime this week. You should offer a provider in your own office first unless she is adamant about being seen today. If she is negative for screening questions and is wanting to be seen today, I will see her if appointments are available.

## 2019-02-11 NOTE — Telephone Encounter (Signed)
Patient has been using drops Rx in [place] however her ear is completely stopped up. The drops are not cleaning them out. Patient is requesting in office visit. PCP not available, can patient schedule at [facility]?

## 2019-02-11 NOTE — Telephone Encounter (Signed)
I have schedule pt with Sabrina Allison today due to her calling out office and stating that she wanted to be seen today by someone. Pt passed screening and is aware to wear a mask and come in alone.

## 2019-02-11 NOTE — Progress Notes (Signed)
Patient presents to clinic today c/o muffled hearing in the R ear over the past several weeks. Has history of significant cerumen buildup in the canals. Notes she was seen by her PCP for CPE at the end of July and was told to start use of Debrox a few times per week to help soften and flush out the wax but notes it has made things worse for her. Denies ear pain or tinnitus. Denies URI symptoms. Denies symptoms of L ear.   Past Medical History:  Diagnosis Date   Colon polyp 2015   tubular adenoma, sessile serrated adenoma   Elevated fasting glucose    Genital herpes    Hyperlipidemia    Hypothyroid    Mitral regurgitation 2011   trace   Murmur 2011   Sacroiliitis ([place]) 2010   Vitamin D deficiency     Current Outpatient Medications on File Prior to Visit  Medication Sig Dispense Refill   valACYclovir (VALTREX) 500 MG tablet Take 1 tablet (500 mg total) by mouth daily. 90 tablet 3   No current facility-administered medications on file prior to visit.     No Known Allergies  Family History  Problem Relation Age of Onset   Hyperlipidemia Mother    Hypertension Mother    Diabetes Father 25       IDDM at age 82   Alcohol abuse Maternal Grandfather    Diabetes Brother    Breast cancer Neg Hx    Colon cancer Neg Hx     Social History   Socioeconomic History   Marital status: Married    Spouse name: Not on file   Number of children: Not on file   Years of education: Not on file   Highest education level: Not on file  Occupational History   Not on file  Social Needs   Financial resource strain: Not on file   Food insecurity    Worry: Not on file    Inability: Not on file   Transportation needs    Medical: Not on file    Non-medical: Not on file  Tobacco Use   Smoking status: Never Smoker   Smokeless tobacco: Never Used  Substance and Sexual Activity   Alcohol use: Yes    Alcohol/week: 2.0 - 3.0 standard drinks    Types: 2 - 3 Shots of  liquor per week   Drug use: Yes    Frequency: 2.0 times per week    Types: Marijuana   Sexual activity: Yes    Partners: Male    Birth control/protection: Surgical    Comment: married  Lifestyle   Physical activity    Days per week: Not on file    Minutes per session: Not on file   Stress: Not on file  Relationships   Social connections    Talks on phone: Not on file    Gets together: Not on file    Attends religious service: Not on file    Active member of club or organization: Not on file    Attends meetings of clubs or organizations: Not on file    Relationship status: Not on file  Other Topics Concern   Not on file  Social History Narrative   Married to [place]. 3 children [name]   Master's Degree in teaching.    Drinks caffeine.    Takes a daily vitamin   Wears her seatbelt, bicycle helmet, smoke detector in the home.    Exercises routinely  Feels safe in her relationships.    Review of Systems - See HPI.  All other ROS are negative.  Wt 136 lb (61.7 kg)    BMI 24.87 kg/m   Physical Exam Vitals signs reviewed.  Constitutional:      Appearance: Normal appearance.  HENT:     Head: Normocephalic and atraumatic.     Right Ear: Tympanic membrane, ear canal and external ear normal.     Left Ear: There is impacted cerumen.     Nose: Nose normal.  Neck:     Musculoskeletal: Neck supple.  Cardiovascular:     Rate and Rhythm: Normal rate and regular rhythm.     Pulses: Normal pulses.  Pulmonary:     Effort: Pulmonary effort is normal.     Breath sounds: Normal breath sounds.  Neurological:     General: No focal deficit present.     Mental Status: She is alert.  Psychiatric:        Mood and Affect: Mood normal.     Recent Results (from the past 2160 hour(s))  Cytology - PAP     Status: Abnormal   Collection Time: 01/23/19 12:00 AM  Result Value Ref Range   Adequacy (A)     Satisfactory for evaluation  endocervical/transformation zone  component PRESENT.   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0 K/uL   Neutrophils Relative % 64.5 43.0 - 77.0 %   Lymphocytes Relative 26.1 12.0 - 46.0 %   Monocytes Relative 7.8 3.0 - 12.0 %   Eosinophils Relative 0.7 0.0 - 5.0 %   Basophils Relative 0.9 0.0 - 3.0 %   Neutro Abs 3.7 1.4 - 7.7 K/uL   Lymphs Abs 1.5 0.7 - 4.0 K/uL   Monocytes Absolute 0.4 0.1 - 1.0 K/uL   Eosinophils Absolute 0.0 0.0 - 0.7 K/uL   Basophils Absolute 0.1 0.0 - 0.1 K/uL  Comprehensive metabolic panel     Status: Abnormal   Collection Time: 01/23/19  2:31 PM  Result Value Ref Range   Sodium 138 135 - 145 mEq/L   Potassium 4.3 3.5 - 5.1 mEq/L   Chloride 100 96 - 112 mEq/L   CO2 28 19 - 32 mEq/L   Glucose, Bld 98 70 - 99 mg/dL   BUN 25 (H) 6 - 23 mg/dL   Creatinine, Ser 0.69 0.40 - 1.20 mg/dL   Total Bilirubin 0.4 0.2 - 1.2 mg/dL   Alkaline Phosphatase 44 39 - 117 U/L   AST 26 0 - 37 U/L   ALT 22 0 - 35 U/L   Total Protein 7.4 6.0 -  8.3 g/dL   Albumin 5.0 3.5 - 5.2 g/dL   Calcium 10.1 8.4 - 10.5 mg/dL   GFR 87.60 >60.00 mL/min  Hemoglobin A1c     Status: None   Collection Time: 01/23/19  2:31 PM  Result Value Ref Range   Hgb A1c MFr Bld 5.4 4.6 - 6.5 %    Comment: Glycemic Control Guidelines for People with Diabetes:Non Diabetic:  <6%Goal of Therapy:  <7%Additional Action Suggested:  >8%   Lipid panel     Status: Abnormal   Collection Time: 01/23/19  2:31 PM  Result Value Ref Range   Cholesterol 277 (H) 0 - 200 mg/dL    Comment: ATP III Classification       Desirable:  < 200 mg/dL               Borderline High:  200 - 239 mg/dL          High:  > = 240 mg/dL   Triglycerides 117.0 0.0 - 149.0 mg/dL    Comment: Normal:  <150 mg/dLBorderline High:  150 - 199 mg/dL   HDL 114.60 >39.00 mg/dL   VLDL 23.4 0.0 - 40.0 mg/dL   LDL Cholesterol 139 (H) 0 - 99 mg/dL   Total CHOL/HDL Ratio 2     Comment:                Men          Women1/2 Average Risk     3.4          3.3Average Risk          5.0          4.42X Average Risk          9.6          7.13X Average Risk          15.0          11.0                       NonHDL 162.31     Comment: NOTE:  Non-HDL goal should be 30 mg/dL higher than patient's LDL goal (i.e. LDL goal of < 70 mg/dL, would have non-HDL goal of < 100 mg/dL)  TSH     Status: Abnormal   Collection Time: 01/23/19  2:31 PM  Result Value Ref Range   TSH 4.70 (H) 0.35 - 4.50 uIU/mL  T4, free     Status: None   Collection Time: 01/23/19  2:31 PM  Result Value Ref Range   Free T4 0.66 0.60 - 1.60 ng/dL    Comment: Specimens from patients who are undergoing biotin therapy and /or ingesting biotin supplements may contain high levels of biotin.  The higher biotin concentration in these specimens interferes with this Free T4 assay.  Specimens that contain high levels  of biotin may cause false high results for this Free T4 assay.  Please interpret results in light of the total clinical presentation of the patient.      Assessment/Plan: 1. Impacted cerumen of right ear Unable to remove with curette. Removed successfully with irrigation. Repeat examination shows healthy canal and TM. Home measures reviewed. Handout given.   Leeanne Rio, PA-C

## 2019-02-11 NOTE — Patient Instructions (Signed)
Earwax Buildup, Adult The ears produce a substance called earwax that helps keep bacteria out of the ear and protects the skin in the ear canal. Occasionally, earwax can build up in the ear and cause discomfort or hearing loss. What increases the risk? This condition is more likely to develop in people who:  Are female.  Are elderly.  Naturally produce more earwax.  Clean their ears often with cotton swabs.  Use earplugs often.  Use in-ear headphones often.  Wear hearing aids.  Have narrow ear canals.  Have earwax that is overly thick or sticky.  Have eczema.  Are dehydrated.  Have excess hair in the ear canal. What are the signs or symptoms? Symptoms of this condition include:  Reduced or muffled hearing.  A feeling of fullness in the ear or feeling that the ear is plugged.  Fluid coming from the ear.  Ear pain.  Ear itch.  Ringing in the ear.  Coughing.  An obvious piece of earwax that can be seen inside the ear canal. How is this diagnosed? This condition may be diagnosed based on:  Your symptoms.  Your medical history.  An ear exam. During the exam, your health care provider will look into your ear with an instrument called an otoscope. You may have tests, including a hearing test. How is this treated? This condition may be treated by:  Using ear drops to soften the earwax.  Having the earwax removed by a health care provider. The health care provider may: ? Flush the ear with water. ? Use an instrument that has a loop on the end (curette). ? Use a suction device.  Surgery to remove the wax buildup. This may be done in severe cases. Follow these instructions at home:   Take over-the-counter and prescription medicines only as told by your health care provider.  Do not put any objects, including cotton swabs, into your ear. You can clean the opening of your ear canal with a washcloth or facial tissue.  Follow instructions from your health care  provider about cleaning your ears. Do not over-clean your ears.  Drink enough fluid to keep your urine clear or pale yellow. This will help to thin the earwax.  Keep all follow-up visits as told by your health care provider. If earwax builds up in your ears often or if you use hearing aids, consider seeing your health care provider for routine, preventive ear cleanings. Ask your health care provider how often you should schedule your cleanings.  If you have hearing aids, clean them according to instructions from the manufacturer and your health care provider. Contact a health care provider if:  You have ear pain.  You develop a fever.  You have blood, pus, or other fluid coming from your ear.  You have hearing loss.  You have ringing in your ears that does not go away.  Your symptoms do not improve with treatment.  You feel like the room is spinning (vertigo). Summary  Earwax can build up in the ear and cause discomfort or hearing loss.  The most common symptoms of this condition include reduced or muffled hearing and a feeling of fullness in the ear or feeling that the ear is plugged.  This condition may be diagnosed based on your symptoms, your medical history, and an ear exam.  This condition may be treated by using ear drops to soften the earwax or by having the earwax removed by a health care provider.  Do not put any   objects, including cotton swabs, into your ear. You can clean the opening of your ear canal with a washcloth or facial tissue. This information is not intended to replace advice given to you by your health care provider. Make sure you discuss any questions you have with your health care provider. Document Released: 07/21/2004 Document Revised: 05/26/2017 Document Reviewed: 08/24/2016 Elsevier Patient Education  2020 Elsevier Inc.  

## 2019-03-19 ENCOUNTER — Other Ambulatory Visit: Payer: Self-pay

## 2019-03-19 ENCOUNTER — Encounter: Payer: Self-pay | Admitting: Obstetrics & Gynecology

## 2019-03-19 ENCOUNTER — Ambulatory Visit: Payer: Managed Care, Other (non HMO) | Admitting: Obstetrics & Gynecology

## 2019-03-19 VITALS — BP 132/78 | Ht 62.0 in | Wt 134.0 lb

## 2019-03-19 DIAGNOSIS — R87610 Atypical squamous cells of undetermined significance on cytologic smear of cervix (ASC-US): Secondary | ICD-10-CM

## 2019-03-19 NOTE — Progress Notes (Signed)
    Sabrina Allison 06-02-1961 [id number]        58 y.o.  G3P3L3 Married  RP: Referred for ASCUS in 12/2018 for Colposcopy  HPI: Pap test 01/23/2019 showed ASCUS/HPV HR Negative.  Previous Pap test was normal in 2017.  No previous cervical dysplasia/treatment.   OB History  Gravida Para Term Preterm AB Living  3 3       3   SAB TAB Ectopic Multiple Live Births               # Outcome Date GA Lbr Len/2nd Weight Sex Delivery Anes PTL Lv  3 Para           2 Para           1 Para             Past medical history,surgical history, problem list, medications, allergies, family history and social history were all reviewed and documented in the EPIC chart.   Directed ROS with pertinent positives and negatives documented in the history of present illness/assessment and plan.  Exam:  Vitals:   03/19/19 1410  BP: 132/78  Weight: 134 lb (60.8 kg)  Height: 5\' 2"  (1.575 m)   General appearance:  Normal  Colposcopy Procedure Note [name] 03/19/2019  Indications: ASCUS for Colposcopy  Procedure Details  The risks and benefits of the procedure and Verbal informed consent obtained.  Speculum placed in vagina and excellent visualization of cervix achieved, cervix swabbed x 3 with acetic acid solution.  Findings:  Cervix colposcopy: Physical Exam Genitourinary:       Vaginal colposcopy: Normal  Vulvar colposcopy: Normal  Perirectal colposcopy: Grossly normal  The cervix was sprayed with Hurricane before performing the cervical biopsies.  Specimens: HPV 16-18-45.  Cervical Bx at 9 O'Clock.  Complications:  Good hemostasis with Silver Nitrate. . Plan:  Management per results   Assessment/Plan:  58 y.o. G3P3   1. ASCUS of cervix with negative high risk HPV ASCUS with negative high-risk HPV.  Decision with patient to proceed with a colposcopy.  Procedure reviewed.  Colposcopy performed without difficulty or complication.  Cervical biopsy taken at 9:00.   Well-tolerated by patient.  Precautions discussed.  Management per results.  Counseling on above issues and coordination of care more than 50% for 20 minutes.  Princess Bruins MD, 2:19 PM 03/19/2019

## 2019-03-21 LAB — TISSUE SPECIMEN

## 2019-03-21 LAB — PATHOLOGY REPORT

## 2019-03-21 LAB — HPV TYPE 16 AND 18/45 RNA
HPV Type 16 RNA: NOT DETECTED
HPV Type 18/45 RNA: NOT DETECTED

## 2019-03-23 ENCOUNTER — Encounter: Payer: Self-pay | Admitting: Obstetrics & Gynecology

## 2019-03-23 NOTE — Patient Instructions (Signed)
1. ASCUS of cervix with negative high risk HPV ASCUS with negative high-risk HPV.  Decision with patient to proceed with a colposcopy.  Procedure reviewed.  Colposcopy performed without difficulty or complication.  Cervical biopsy taken at 9:00.  Well-tolerated by patient.  Precautions discussed.  Management per results.  Miyeko, it was a pleasure meeting you today!  I will inform you of your results as soon as they are available.

## 2019-06-30 ENCOUNTER — Encounter: Payer: Self-pay | Admitting: Family Medicine

## 2019-07-01 NOTE — Telephone Encounter (Signed)
Please advise if okay to draft letter and mail stating pt had CPE on 01/23/2019 with recommended lab work.

## 2019-07-01 NOTE — Telephone Encounter (Signed)
Of course it is okay to create a letter and mail confirming patient CPE and completion of labs at her request.

## 2019-08-22 ENCOUNTER — Ambulatory Visit: Payer: Managed Care, Other (non HMO) | Attending: Internal Medicine

## 2019-08-22 DIAGNOSIS — Z23 Encounter for immunization: Secondary | ICD-10-CM

## 2019-08-22 NOTE — Progress Notes (Signed)
   Covid-19 Vaccination Clinic  Name:  Sabrina Allison    MRN: [id number] DOB: Nov 20, 1960  08/22/2019  [name] was observed post Covid-19 immunization for 15 minutes without incidence. She was provided with Vaccine Information Sheet and instruction to access the V-Safe system.   [name] was instructed to call 911 with any severe reactions post vaccine: Marland Kitchen Difficulty breathing  . Swelling of your face and throat  . A fast heartbeat  . A bad rash all over your body  . Dizziness and weakness    Immunizations Administered    Name Date Dose VIS Date Route   Pfizer COVID-19 Vaccine 08/22/2019  3:46 PM 0.3 mL 06/07/2019 Intramuscular   Manufacturer: [place]   Lot: [id number]   [place]: [id number]

## 2019-09-17 ENCOUNTER — Ambulatory Visit: Payer: Managed Care, Other (non HMO) | Attending: Internal Medicine

## 2019-09-17 DIAGNOSIS — Z23 Encounter for immunization: Secondary | ICD-10-CM

## 2019-09-17 NOTE — Progress Notes (Signed)
   Covid-19 Vaccination Clinic  Name:  Sabrina Allison    MRN: [id number] DOB: 1961-01-12  09/17/2019  [name] was observed post Covid-19 immunization for 15 minutes without incident. She was provided with Vaccine Information Sheet and instruction to access the V-Safe system.   [name] was instructed to call 911 with any severe reactions post vaccine: Marland Kitchen Difficulty breathing  . Swelling of face and throat  . A fast heartbeat  . A bad rash all over body  . Dizziness and weakness   Immunizations Administered    Name Date Dose VIS Date Route   Pfizer COVID-19 Vaccine 09/17/2019  3:34 PM 0.3 mL 06/07/2019 Intramuscular   Manufacturer: [place]   Lot: [id number]   [place]: [id number]

## 2020-01-25 ENCOUNTER — Encounter: Payer: Self-pay | Admitting: Family Medicine

## 2020-01-29 ENCOUNTER — Encounter: Payer: Self-pay | Admitting: Family Medicine

## 2020-01-29 ENCOUNTER — Ambulatory Visit (INDEPENDENT_AMBULATORY_CARE_PROVIDER_SITE_OTHER): Payer: Managed Care, Other (non HMO) | Admitting: Family Medicine

## 2020-01-29 ENCOUNTER — Other Ambulatory Visit: Payer: Self-pay

## 2020-01-29 VITALS — BP 117/77 | HR 60 | Temp 97.4°F | Resp 18 | Ht 62.0 in | Wt 140.6 lb

## 2020-01-29 DIAGNOSIS — Z131 Encounter for screening for diabetes mellitus: Secondary | ICD-10-CM

## 2020-01-29 DIAGNOSIS — D7589 Other specified diseases of blood and blood-forming organs: Secondary | ICD-10-CM

## 2020-01-29 DIAGNOSIS — E7849 Other hyperlipidemia: Secondary | ICD-10-CM | POA: Diagnosis not present

## 2020-01-29 DIAGNOSIS — A6000 Herpesviral infection of urogenital system, unspecified: Secondary | ICD-10-CM | POA: Diagnosis not present

## 2020-01-29 DIAGNOSIS — R7989 Other specified abnormal findings of blood chemistry: Secondary | ICD-10-CM | POA: Diagnosis not present

## 2020-01-29 DIAGNOSIS — Z0000 Encounter for general adult medical examination without abnormal findings: Secondary | ICD-10-CM

## 2020-01-29 MED ORDER — VALACYCLOVIR HCL 500 MG PO TABS: 500.0000 mg | ORAL_TABLET | Freq: Every day | ORAL | 3 refills | Status: DC

## 2020-01-29 NOTE — Patient Instructions (Signed)
Health Maintenance, Female Adopting a healthy lifestyle and getting preventive care are important in promoting health and wellness. Ask your health care provider about:  The right schedule for you to have regular tests and exams.  Things you can do on your own to prevent diseases and keep yourself healthy. What should I know about diet, weight, and exercise? Eat a healthy diet   Eat a diet that includes plenty of vegetables, fruits, low-fat dairy products, and lean protein.  Do not eat a lot of foods that are high in solid fats, added sugars, or sodium. Maintain a healthy weight Body mass index (BMI) is used to identify weight problems. It estimates body fat based on height and weight. Your health care provider can help determine your BMI and help you achieve or maintain a healthy weight. Get regular exercise Get regular exercise. This is one of the most important things you can do for your health. Most adults should:  Exercise for at least 150 minutes each week. The exercise should increase your heart rate and make you sweat (moderate-intensity exercise).  Do strengthening exercises at least twice a week. This is in addition to the moderate-intensity exercise.  Spend less time sitting. Even light physical activity can be beneficial. Watch cholesterol and blood lipids Have your blood tested for lipids and cholesterol at 58 years of age, then have this test every 5 years. Have your cholesterol levels checked more often if:  Your lipid or cholesterol levels are high.  You are older than 58 years of age.  You are at high risk for heart disease. What should I know about cancer screening? Depending on your health history and family history, you may need to have cancer screening at various ages. This may include screening for:  Breast cancer.  Cervical cancer.  Colorectal cancer.  Skin cancer.  Lung cancer. What should I know about heart disease, diabetes, and high blood  pressure? Blood pressure and heart disease  High blood pressure causes heart disease and increases the risk of stroke. This is more likely to develop in people who have high blood pressure readings, are of African descent, or are overweight.  Have your blood pressure checked: ? Every 3-5 years if you are 18-39 years of age. ? Every year if you are 40 years old or older. Diabetes Have regular diabetes screenings. This checks your fasting blood sugar level. Have the screening done:  Once every three years after age 40 if you are at a normal weight and have a low risk for diabetes.  More often and at a younger age if you are overweight or have a high risk for diabetes. What should I know about preventing infection? Hepatitis B If you have a higher risk for hepatitis B, you should be screened for this virus. Talk with your health care provider to find out if you are at risk for hepatitis B infection. Hepatitis C Testing is recommended for:  Everyone born from 1945 through 1965.  Anyone with known risk factors for hepatitis C. Sexually transmitted infections (STIs)  Get screened for STIs, including gonorrhea and chlamydia, if: ? You are sexually active and are younger than 58 years of age. ? You are older than 58 years of age and your health care provider tells you that you are at risk for this type of infection. ? Your sexual activity has changed since you were last screened, and you are at increased risk for chlamydia or gonorrhea. Ask your health care provider if   you are at risk.  Ask your health care provider about whether you are at high risk for HIV. Your health care provider may recommend a prescription medicine to help prevent HIV infection. If you choose to take medicine to prevent HIV, you should first get tested for HIV. You should then be tested every 3 months for as long as you are taking the medicine. Pregnancy  If you are about to stop having your period (premenopausal) and  you may become pregnant, seek counseling before you get pregnant.  Take 400 to 800 micrograms (mcg) of folic acid every day if you become pregnant.  Ask for birth control (contraception) if you want to prevent pregnancy. Osteoporosis and menopause Osteoporosis is a disease in which the bones lose minerals and strength with aging. This can result in bone fractures. If you are 65 years old or older, or if you are at risk for osteoporosis and fractures, ask your health care provider if you should:  Be screened for bone loss.  Take a calcium or vitamin D supplement to lower your risk of fractures.  Be given hormone replacement therapy (HRT) to treat symptoms of menopause. Follow these instructions at home: Lifestyle  Do not use any products that contain nicotine or tobacco, such as cigarettes, e-cigarettes, and chewing tobacco. If you need help quitting, ask your health care provider.  Do not use street drugs.  Do not share needles.  Ask your health care provider for help if you need support or information about quitting drugs. Alcohol use  Do not drink alcohol if: ? Your health care provider tells you not to drink. ? You are pregnant, may be pregnant, or are planning to become pregnant.  If you drink alcohol: ? Limit how much you use to 0-1 drink a day. ? Limit intake if you are breastfeeding.  Be aware of how much alcohol is in your drink. In the [place] one drink equals one 12 oz bottle of beer (355 mL), one 5 oz glass of wine (148 mL), or one 1 oz glass of hard liquor (44 mL). General instructions  Schedule regular health, dental, and eye exams.  Stay current with your vaccines.  Tell your health care provider if: ? You often feel depressed. ? You have ever been abused or do not feel safe at home. Summary  Adopting a healthy lifestyle and getting preventive care are important in promoting health and wellness.  Follow your health care provider's instructions about healthy  diet, exercising, and getting tested or screened for diseases.  Follow your health care provider's instructions on monitoring your cholesterol and blood pressure. This information is not intended to replace advice given to you by your health care provider. Make sure you discuss any questions you have with your health care provider. Document Revised: 06/06/2018 Document Reviewed: 06/06/2018 Elsevier Patient Education  2020 Elsevier Inc.  

## 2020-01-29 NOTE — Progress Notes (Signed)
This visit occurred during the SARS-CoV-2 public health emergency.  Safety protocols were in place, including screening questions prior to the visit, additional usage of staff PPE, and extensive cleaning of exam room while observing appropriate contact time as indicated for disinfecting solutions.    Patient ID: Sabrina Allison, female  DOB: [birth year], 59 y.o.   MRN: [id number] Patient Care Team    Relationship Specialty Notifications Start End  Ma Hillock, DO PCP - General Family Medicine  03/29/16     Chief Complaint  Patient presents with  . Annual Exam    Pt states fasting    Subjective:  Sabrina Allison is a 59 y.o.  Female  present for CPE. All past medical history, surgical history, allergies, family history, immunizations, medications and social history were updated in the electronic medical record today. All recent labs, ED visits and hospitalizations within the last year were reviewed.  Elevated TSH Has had elevated TSH and Positive TPO-ab  test. Has been  Felling well and not needed medications.  Health maintenance:  Colonoscopy: completed7/19/2018, polyps,5 yr f/u, Dr. Carlean Purl Mammogram: completed 01/17/2019. breast center.[facility], desires every 2 yr screen. Cervical cancer screening: last pap: 12/2018, est w/ gyn Immunizations: tdap 2017UTD, Influenza UTD (encouraged yearly), shingrix series completed. COVID series completed Infectious disease screening: HIV and Hep CDeclined  DEXA: N/A Assistive device: none Oxygen WUG:QBVQ Patient has a Dental home. Hospitalizations/ED visits: reviewed   Depression screen [facility] 2/9 01/29/2020 01/23/2019 04/13/2018 01/09/2018 11/25/2016  Decreased Interest 0 0 0 0 0  Down, Depressed, Hopeless 0 0 0 0 0  PHQ - 2 Score 0 0 0 0 0   No flowsheet data found.   Immunization History  Administered Date(s) Administered  . Hepatitis A 12/04/2015  . Hepatitis A, Adult 06/08/2018  . PFIZER SARS-COV-2 Vaccination 08/22/2019, 09/17/2019   . Tdap 12/04/2015  . Typhoid Live 06/08/2018  . Zoster Recombinat (Shingrix) 01/09/2018, 04/13/2018     Past Medical History:  Diagnosis Date  . Colon polyp 2015   tubular adenoma, sessile serrated adenoma  . Elevated fasting glucose   . Genital herpes   . Hyperlipidemia   . Hypothyroid   . Mitral regurgitation 2011   trace  . Murmur 2011  . Sacroiliitis ([place]) 2010  . Vitamin D deficiency    No Known Allergies Past Surgical History:  Procedure Laterality Date  . COLONOSCOPY W/ POLYPECTOMY  2015   tubular adenoma, sessile serrated adenoma - 3 year recall per records.   . TUBAL LIGATION     Family History  Problem Relation Age of Onset  . Hyperlipidemia Mother   . Hypertension Mother   . Diabetes Father 24       IDDM at age 26  . Alcohol abuse Maternal Grandfather   . Diabetes Brother   . Breast cancer Neg Hx   . Colon cancer Neg Hx    Social History   Social History Narrative   Married to [employer]. 3 children Sabrina Allison   Master's Degree in teaching.    Drinks caffeine.    Takes a daily vitamin   Wears her seatbelt, bicycle helmet, smoke detector in the home.    Exercises routinely   Feels safe in her relationships.     Allergies as of 01/29/2020   No Known Allergies     Medication List       Accurate as of January 29, 2020  1:31 PM. If you have any questions, ask your nurse or doctor.  STOP taking these medications   cephALEXin 500 MG capsule Commonly known as: KEFLEX Stopped by: Howard Pouch, DO     TAKE these medications   valACYclovir 500 MG tablet Commonly known as: VALTREX Take 1 tablet (500 mg total) by mouth daily.       All past medical history, surgical history, allergies, family history, immunizations andmedications were updated in the EMR today and reviewed under the history and medication portions of their EMR.     No results found for this or any previous visit (from the past 2160 hour(s)).  No results  found.   ROS: 14 pt review of systems performed and negative (unless mentioned in an HPI)  Objective: BP 117/77 (BP Location: Left Arm, Patient Position: Sitting, Cuff Size: Normal)   Pulse 60   Temp (!) 97.4 F (36.3 C) (Oral)   Resp 18   Ht '5\' 2"'  (1.575 m)   Wt 140 lb 9.6 oz (63.8 kg)   SpO2 99%   BMI 25.72 kg/m  Gen: Afebrile. No acute distress. Nontoxic in appearance, well-developed, well-nourished,  Pleasant female.  HENT: AT. . Bilateral TM visualized and normal in appearance, normal external auditory canal. MMM, no oral lesions, adequate dentition. Bilateral nares within normal limits. Throat without erythema, ulcerations or exudates. no Cough on exam, no hoarseness on exam. Eyes:Pupils Equal Round Reactive to light, Extraocular movements intact,  Conjunctiva without redness, discharge or icterus. Neck/lymp/endocrine: Supple,no lymphadenopathy, no thyromegaly CV: RRR no murmur, no edema, +2/4 P posterior tibialis pulses. No JVD. Chest: CTAB, no wheeze, rhonchi or crackles. normal Respiratory effort. good Air movement. Abd: Soft. flat. NTND. BS present. no Masses palpated. No hepatosplenomegaly. No rebound tenderness or guarding. Skin: no rashes, purpura or petechiae. Warm and well-perfused. Skin intact. Neuro/Msk:  Normal gait. PERLA. EOMi. Alert. Oriented x3.  Cranial nerves II through XII intact. Muscle strength 5/5 upper/lower extremity. DTRs equal bilaterally. Psych: Normal affect, dress and demeanor. Normal speech. Normal thought content and judgment.  No exam data present  Assessment/plan: [name] is a 59 y.o. female present for Genital herpes simplex, unspecified site Faxed to mail in pharmacy with Christella Scheuermann (unable to add to Epic).  - continue valACYclovir   hyperlipidemia Continue diet and exercise.  - Comp Met (CMET) - Lipid panel Elevated TSH Has not needed medications.  - TSH - T4, free - Lipid panel Macrocytosis without anemia - CBC - poss  etiology thyroid.  Diabetes mellitus screening - Hemoglobin A1c Encounter for preventative adult health care examination Patient was encouraged to exercise greater than 150 minutes a week. Patient was encouraged to choose a diet filled with fresh fruits and vegetables, and lean meats. AVS provided to patient today for education/recommendation on gender specific health and safety maintenance. Colonoscopy: completed7/19/2018, polyps,5 yr f/u, Dr. Carlean Purl Mammogram: completed 01/17/2019. breast center.SBE, desires every 2 yr screen. Cervical cancer screening: last pap: 12/2018, est w/ gyn Immunizations: tdap 2017UTD, Influenza UTD (encouraged yearly), shingrix series completed. COVID series completed Infectious disease screening: HIV and Hep CDeclined  DEXA: N/A  Return in about 1 year (around 01/28/2021) for CPE (30 min).   Orders Placed This Encounter  Procedures  . TSH  . T4, free  . CBC  . Comp Met (CMET)  . Hemoglobin A1c  . Lipid panel   Meds ordered this encounter  Medications  . DISCONTD: valACYclovir (VALTREX) 500 MG tablet    Sig: Take 1 tablet (500 mg total) by mouth daily.    Dispense:  90 tablet  Refill:  3  . valACYclovir (VALTREX) 500 MG tablet    Sig: Take 1 tablet (500 mg total) by mouth daily.    Dispense:  90 tablet    Refill:  3   Referral Orders  No referral(s) requested today     Electronically signed by: Howard Pouch, [place]ier[place]

## 2020-01-30 ENCOUNTER — Telehealth: Payer: Self-pay | Admitting: Family Medicine

## 2020-01-30 LAB — CBC
HCT: 40.2 % (ref 35.0–45.0)
Hemoglobin: 13.9 g/dL (ref 11.7–15.5)
MCH: 33.1 pg — ABNORMAL HIGH (ref 27.0–33.0)
MCHC: 34.6 g/dL (ref 32.0–36.0)
MCV: 95.7 fL (ref 80.0–100.0)
MPV: 9.9 fL (ref 7.5–12.5)
Platelets: 220 10*3/uL (ref 140–400)
RBC: 4.2 Million/uL (ref 3.80–5.10)
RDW: 12.5 % (ref 11.0–15.0)
WBC: 5.6 10*3/uL (ref 3.8–10.8)

## 2020-01-30 LAB — COMPREHENSIVE METABOLIC PANEL
AG Ratio: 1.9 (calc) (ref 1.0–2.5)
ALT: 17 U/L (ref 6–29)
AST: 21 U/L (ref 10–35)
Albumin: 4.7 g/dL (ref 3.6–5.1)
Alkaline phosphatase (APISO): 49 U/L (ref 37–153)
BUN: 10 mg/dL (ref 7–25)
CO2: 25 mmol/L (ref 20–32)
Calcium: 9.5 mg/dL (ref 8.6–10.4)
Chloride: 96 mmol/L — ABNORMAL LOW (ref 98–110)
Creat: 0.65 mg/dL (ref 0.50–1.05)
Globulin: 2.5 g/dL (calc) (ref 1.9–3.7)
Glucose, Bld: 93 mg/dL (ref 65–99)
Potassium: 4.7 mmol/L (ref 3.5–5.3)
Sodium: 130 mmol/L — ABNORMAL LOW (ref 135–146)
Total Bilirubin: 0.7 mg/dL (ref 0.2–1.2)
Total Protein: 7.2 g/dL (ref 6.1–8.1)

## 2020-01-30 LAB — T4, FREE: Free T4: 1.1 ng/dL (ref 0.8–1.8)

## 2020-01-30 LAB — LIPID PANEL
Cholesterol: 309 mg/dL — ABNORMAL HIGH
HDL: 112 mg/dL
LDL Cholesterol (Calc): 182 mg/dL — ABNORMAL HIGH
Non-HDL Cholesterol (Calc): 197 mg/dL — ABNORMAL HIGH
Total CHOL/HDL Ratio: 2.8 (calc)
Triglycerides: 47 mg/dL

## 2020-01-30 LAB — HEMOGLOBIN A1C
Hgb A1c MFr Bld: 5.2 %{Hb}
Mean Plasma Glucose: 103 (calc)
eAG (mmol/L): 5.7 (calc)

## 2020-01-30 LAB — TSH: TSH: 5.39 mIU/L — ABNORMAL HIGH (ref 0.40–4.50)

## 2020-01-30 MED ORDER — LEVOTHYROXINE SODIUM 25 MCG PO TABS: 25.0000 ug | ORAL_TABLET | Freq: Every day | ORAL | 0 refills | Status: DC

## 2020-01-30 NOTE — Telephone Encounter (Signed)
Please call patient Liver and kidney function are normal Blood cell counts are normal Diabetes screening/A1c is normal at 5.2 Her electrolytes are normal with the exception of her sodium levels being low at 130.  This is a new finding for her-I would recommend she drink a Gatorade or 1 serving of V8 juice daily is even more helpful in replacing sodium levels.  Would encourage her to do this for the next 2 weeks.  Her levels may be low secondary to recent surgery and fluids given during surgery and/or caused by her thyroid condition.  Low sodium levels can lead to decreased mental clarity, irritability, nausea, headache and in extreme cases seizures.   Thyroid levels-TSH is higher than prior at 5.39. Cholesterol - her HDL/good cholesterol is amazing at 112.  However her bad cholesterol/LDL is significantly high at 182.  This is higher than last year.    -Higher cholesterol levels and low sodium levels can be tied to thyroid conditions.  Since her thyroid levels have been consistently higher than normal and increasing I would suggest we start a low-dose thyroid medicine that she takes daily on an empty stomach before we consider starting a cholesterol medicine.  I have called this into her pharmacy.  Of course, routine exercise and following a low saturated fat/Mediterranean diet is also helpful for lowering LDL.  Over-the-counter supplement called red yeast rice is also helpful.   -Follow-up in 4-5 weeks with provider and we will recheck her thyroid levels and sodium levels at that visit-sooner if needed.  Please schedule her for this follow-up.  Thanks

## 2020-01-30 NOTE — Telephone Encounter (Signed)
Spoke with patient and notified pt of labs and to start levothyroxine 25 mch that has already been sent to her pharmacy. Pt will change diet and exercise more. Pt will call office to schedule 4-5 wk f/u appt at her convenience.

## 2020-02-15 ENCOUNTER — Encounter: Payer: Self-pay | Admitting: Family Medicine

## 2020-03-26 ENCOUNTER — Other Ambulatory Visit: Payer: Self-pay

## 2020-03-26 ENCOUNTER — Encounter: Payer: Self-pay | Admitting: Family Medicine

## 2020-03-26 ENCOUNTER — Other Ambulatory Visit: Payer: Self-pay | Admitting: Family Medicine

## 2020-03-26 ENCOUNTER — Ambulatory Visit (INDEPENDENT_AMBULATORY_CARE_PROVIDER_SITE_OTHER): Payer: Managed Care, Other (non HMO) | Admitting: Family Medicine

## 2020-03-26 VITALS — BP 126/86 | HR 71 | Temp 97.9°F | Ht 62.0 in | Wt 137.0 lb

## 2020-03-26 DIAGNOSIS — E871 Hypo-osmolality and hyponatremia: Secondary | ICD-10-CM | POA: Insufficient documentation

## 2020-03-26 DIAGNOSIS — R7989 Other specified abnormal findings of blood chemistry: Secondary | ICD-10-CM

## 2020-03-26 LAB — T4, FREE: Free T4: 0.66 ng/dL (ref 0.60–1.60)

## 2020-03-26 LAB — TSH: TSH: 2.57 u[IU]/mL (ref 0.35–4.50)

## 2020-03-26 LAB — BASIC METABOLIC PANEL
BUN: 12 mg/dL (ref 6–23)
CO2: 27 mEq/L (ref 19–32)
Calcium: 9.5 mg/dL (ref 8.4–10.5)
Chloride: 98 mEq/L (ref 96–112)
Creatinine, Ser: 0.65 mg/dL (ref 0.40–1.20)
GFR: 93.46 mL/min (ref >60.00)
Glucose, Bld: 97 mg/dL (ref 70–99)
Potassium: 4.6 mEq/L (ref 3.5–5.1)
Sodium: 135 mEq/L (ref 135–145)

## 2020-03-26 MED ORDER — LEVOTHYROXINE SODIUM 25 MCG PO TABS
25.0000 ug | ORAL_TABLET | Freq: Every day | ORAL | 3 refills | Status: DC
Start: 2020-03-26 — End: 2021-03-10

## 2020-03-26 NOTE — Patient Instructions (Addendum)
Great to see you today.    Hypothyroidism  Hypothyroidism is when the thyroid gland does not make enough of certain hormones (it is underactive). The thyroid gland is a small gland located in the lower front part of the neck, just in front of the windpipe (trachea). This gland makes hormones that help control how the body uses food for energy (metabolism) as well as how the heart and brain function. These hormones also play a role in keeping your bones strong. When the thyroid is underactive, it produces too little of the hormones thyroxine (T4) and triiodothyronine (T3). What are the causes? This condition may be caused by:  Hashimoto's disease. This is a disease in which the body's disease-fighting system (immune system) attacks the thyroid gland. This is the most common cause.  Viral infections.  Pregnancy.  Certain medicines.  Birth defects.  Past radiation treatments to the head or neck for cancer.  Past treatment with radioactive iodine.  Past exposure to radiation in the environment.  Past surgical removal of part or all of the thyroid.  Problems with a gland in the center of the brain (pituitary gland).  Lack of enough iodine in the diet. What increases the risk? You are more likely to develop this condition if:  You are female.  You have a family history of thyroid conditions.  You use a medicine called lithium.  You take medicines that affect the immune system (immunosuppressants). What are the signs or symptoms? Symptoms of this condition include:  Feeling as though you have no energy (lethargy).  Not being able to tolerate cold.  Weight gain that is not explained by a change in diet or exercise habits.  Lack of appetite.  Dry skin.  Coarse hair.  Menstrual irregularity.  Slowing of thought processes.  Constipation.  Sadness or depression. How is this diagnosed? This condition may be diagnosed based on:  Your symptoms, your medical history,  and a physical exam.  Blood tests. You may also have imaging tests, such as an ultrasound or MRI. How is this treated? This condition is treated with medicine that replaces the thyroid hormones that your body does not make. After you begin treatment, it may take several weeks for symptoms to go away. Follow these instructions at home:  Take over-the-counter and prescription medicines only as told by your health care provider.  If you start taking any new medicines, tell your health care provider.  Keep all follow-up visits as told by your health care provider. This is important. ? As your condition improves, your dosage of thyroid hormone medicine may change. ? You will need to have blood tests regularly so that your health care provider can monitor your condition. Contact a health care provider if:  Your symptoms do not get better with treatment.  You are taking thyroid replacement medicine and you: ? Sweat a lot. ? Have tremors. ? Feel anxious. ? Lose weight rapidly. ? Cannot tolerate heat. ? Have emotional swings. ? Have diarrhea. ? Feel weak. Get help right away if you have:  Chest pain.  An irregular heartbeat.  A rapid heartbeat.  Difficulty breathing. Summary  Hypothyroidism is when the thyroid gland does not make enough of certain hormones (it is underactive).  When the thyroid is underactive, it produces too little of the hormones thyroxine (T4) and triiodothyronine (T3).  The most common cause is Hashimoto's disease, a disease in which the body's disease-fighting system (immune system) attacks the thyroid gland. The condition can also be  caused by viral infections, medicine, pregnancy, or past radiation treatment to the head or neck.  Symptoms may include weight gain, dry skin, constipation, feeling as though you do not have energy, and not being able to tolerate cold.  This condition is treated with medicine to replace the thyroid hormones that your body does  not make. This information is not intended to replace advice given to you by your health care provider. Make sure you discuss any questions you have with your health care provider. Document Revised: 05/26/2017 Document Reviewed: 05/24/2017 Elsevier Patient Education  2020 [employer].

## 2020-03-26 NOTE — Progress Notes (Signed)
This visit occurred during the SARS-CoV-2 public health emergency.  Safety protocols were in place, including screening questions prior to the visit, additional usage of staff PPE, and extensive cleaning of exam room while observing appropriate contact time as indicated for disinfecting solutions.    Sabrina Allison , 03/11/1961, 59 y.o., female MRN: [id number] Patient Care Team    Relationship Specialty Notifications Start End  Sabrina Hillock, DO PCP - General Family Medicine  03/29/16     Chief Complaint  Patient presents with  . Follow-up    thyroid     Subjective: Pt presents for an OV for follow up on thyroid and hyponatremia found on routine labs in August. She has been asymptomatic.  Pt reports she is tolerating levothyroxine low dose. 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 She had elected not to start medication and watch until this past august. She reports she has noticed she has stopped get night sweats since starting the medication. She believes her low sodium level of 130 was bc she was fasting for her appt and had played tennis in the heat.   Depression screen [facility] 2/9 03/26/2020 01/29/2020 01/23/2019 04/13/2018 01/09/2018  Decreased Interest 0 0 0 0 0  Down, Depressed, Hopeless 0 0 0 0 0  PHQ - 2 Score 0 0 0 0 0    No Known Allergies Social History   Social History Narrative   Married to [place]. 3 children [name]   Master's Degree in teaching.    Drinks caffeine.    Takes a daily vitamin   Wears her seatbelt, bicycle helmet, smoke detector in the home.    Exercises routinely   Feels safe in her relationships.    Past Medical History:  Diagnosis Date  . Colon polyp 2015   tubular adenoma, sessile serrated adenoma  . Elevated fasting glucose   . Genital herpes   . Hyperlipidemia   . Hypothyroid   . Mitral regurgitation 2011   trace  .  Murmur 2011  . Sacroiliitis ([place]) 2010  . Vitamin D deficiency    Past Surgical History:  Procedure Laterality Date  . COLONOSCOPY W/ POLYPECTOMY  2015   tubular adenoma, sessile serrated adenoma - 3 year recall per records.   . TUBAL LIGATION     Family History  Problem Relation Age of Onset  . Hyperlipidemia Mother   . Hypertension Mother   . Diabetes Father 36       IDDM at age 74  . Alcohol abuse Maternal Grandfather   . Diabetes Brother   . Breast cancer Neg Hx   . Colon cancer Neg Hx    Allergies as of 03/26/2020   No Known Allergies     Medication List       Accurate as of March 26, 2020  8:49 AM. If you have any questions, ask your nurse or doctor.        levothyroxine 25 MCG tablet Commonly known as: SYNTHROID Take 1 tablet (25 mcg total) by mouth daily before breakfast.   valACYclovir 500 MG tablet Commonly known as: VALTREX Take 1 tablet (500 mg total) by mouth daily.       All past medical history, surgical history, allergies, family history, immunizations andmedications were updated in the EMR today and reviewed under the history and medication portions of their EMR.     ROS:  Negative, with the exception of above mentioned in HPI   Objective:  BP 126/86   Pulse 71   Temp 97.9 F (36.6 C) (Oral)   Ht 5\' 2"  (1.575 m)   Wt 137 lb (62.1 kg)   SpO2 99%   BMI 25.06 kg/m  Body mass index is 25.06 kg/m. Gen: Afebrile. No acute distress. Nontoxic in appearance, well developed, well nourished.  HENT: AT. .  Eyes:Pupils Equal Round Reactive to light, Extraocular movements intact,  Conjunctiva without redness, discharge or icterus. Neck/lymp/endocrine: Supple,no lymphadenopathy, no thyromegaly CV: RRR Chest: CTAB, no wheeze or crackles.  Skin: no rashes, purpura or petechiae.  Neuro: Normal gait. PERLA. EOMi. Alert. Oriented x3  Psych: Normal affect, dress and demeanor. Normal speech. Normal thought content and judgment.  No exam data  present No results found. No results found for this or any previous visit (from the past 24 hour(s)).  Assessment/Plan: Sabrina Allison is a 59 y.o. female present for OV for  Elevated TSH/hypothyroid recheck levels today after start of levothyroxine 25 mcg qd. Refill will be provided after results received in appropriate dose based on results - T4, free - TSH If normal follow yearly at cpe  Hyponatremia Recheck levels today to ensure returned to normal for her. Likely from fasting state and sweaty/exercising in the heat. She has remained asymptomatic.  - Basic Metabolic Panel (BMET)   Reviewed expectations re: course of current medical issues.  Discussed self-management of symptoms.  Outlined signs and symptoms indicating need for more acute intervention.  Patient verbalized understanding and all questions were answered.  Patient received an After-Visit Summary.    Orders Placed This Encounter  Procedures  . T4, free  . TSH  . Basic Metabolic Panel (BMET)   No orders of the defined types were placed in this encounter.  Referral Orders  No referral(s) requested today     Note is dictated utilizing voice recognition software. Although note has been proof read prior to signing, occasional typographical errors still can be missed. If any questions arise, please do not hesitate to call for verification.   electronically signed by:  Howard Pouch, DO  [place]

## 2020-06-24 ENCOUNTER — Encounter: Payer: Self-pay | Admitting: Family Medicine

## 2020-06-27 ENCOUNTER — Encounter: Payer: Self-pay | Admitting: Family Medicine

## 2021-01-18 ENCOUNTER — Other Ambulatory Visit: Payer: Self-pay

## 2021-01-18 DIAGNOSIS — A6000 Herpesviral infection of urogenital system, unspecified: Secondary | ICD-10-CM

## 2021-01-18 MED ORDER — VALACYCLOVIR HCL 500 MG PO TABS: 500.0000 mg | ORAL_TABLET | Freq: Every day | ORAL | 0 refills | Status: DC

## 2021-01-20 ENCOUNTER — Other Ambulatory Visit: Payer: Self-pay | Admitting: Family Medicine

## 2021-01-20 DIAGNOSIS — Z1231 Encounter for screening mammogram for malignant neoplasm of breast: Secondary | ICD-10-CM

## 2021-01-27 ENCOUNTER — Other Ambulatory Visit: Payer: Self-pay

## 2021-01-27 ENCOUNTER — Ambulatory Visit (INDEPENDENT_AMBULATORY_CARE_PROVIDER_SITE_OTHER): Payer: Managed Care, Other (non HMO)

## 2021-01-27 DIAGNOSIS — Z1231 Encounter for screening mammogram for malignant neoplasm of breast: Secondary | ICD-10-CM | POA: Diagnosis not present

## 2021-02-22 ENCOUNTER — Encounter: Payer: Managed Care, Other (non HMO) | Admitting: Family Medicine

## 2021-03-10 ENCOUNTER — Ambulatory Visit (INDEPENDENT_AMBULATORY_CARE_PROVIDER_SITE_OTHER): Payer: Managed Care, Other (non HMO) | Admitting: Family Medicine

## 2021-03-10 ENCOUNTER — Other Ambulatory Visit: Payer: Self-pay

## 2021-03-10 ENCOUNTER — Telehealth: Payer: Self-pay | Admitting: Family Medicine

## 2021-03-10 ENCOUNTER — Encounter: Payer: Self-pay | Admitting: Family Medicine

## 2021-03-10 VITALS — BP 115/83 | HR 59 | Temp 97.4°F | Ht 61.81 in | Wt 138.6 lb

## 2021-03-10 DIAGNOSIS — Z23 Encounter for immunization: Secondary | ICD-10-CM | POA: Diagnosis not present

## 2021-03-10 DIAGNOSIS — A6000 Herpesviral infection of urogenital system, unspecified: Secondary | ICD-10-CM

## 2021-03-10 DIAGNOSIS — R7989 Other specified abnormal findings of blood chemistry: Secondary | ICD-10-CM

## 2021-03-10 DIAGNOSIS — Z0000 Encounter for general adult medical examination without abnormal findings: Secondary | ICD-10-CM

## 2021-03-10 DIAGNOSIS — E782 Mixed hyperlipidemia: Secondary | ICD-10-CM

## 2021-03-10 DIAGNOSIS — K635 Polyp of colon: Secondary | ICD-10-CM | POA: Diagnosis not present

## 2021-03-10 DIAGNOSIS — Z131 Encounter for screening for diabetes mellitus: Secondary | ICD-10-CM

## 2021-03-10 LAB — COMPREHENSIVE METABOLIC PANEL
ALT: 17 U/L (ref 0–35)
AST: 18 U/L (ref 0–37)
Albumin: 4.6 g/dL (ref 3.5–5.2)
Alkaline Phosphatase: 47 U/L (ref 39–117)
BUN: 17 mg/dL (ref 6–23)
CO2: 27 mEq/L (ref 19–32)
Calcium: 9.8 mg/dL (ref 8.4–10.5)
Chloride: 102 mEq/L (ref 96–112)
Creatinine, Ser: 0.63 mg/dL (ref 0.40–1.20)
GFR: 97.09 mL/min (ref >60.00)
Glucose, Bld: 97 mg/dL (ref 70–99)
Potassium: 4.6 mEq/L (ref 3.5–5.1)
Sodium: 138 mEq/L (ref 135–145)
Total Bilirubin: 0.4 mg/dL (ref 0.2–1.2)
Total Protein: 7.1 g/dL (ref 6.0–8.3)

## 2021-03-10 LAB — LIPID PANEL
Cholesterol: 305 mg/dL — ABNORMAL HIGH (ref 0–200)
HDL: 103 mg/dL (ref >39.00)
LDL Cholesterol: 193 mg/dL — ABNORMAL HIGH (ref 0–99)
NonHDL: 202.2
Total CHOL/HDL Ratio: 3
Triglycerides: 48 mg/dL (ref 0.0–149.0)
VLDL: 9.6 mg/dL (ref 0.0–40.0)

## 2021-03-10 LAB — CBC WITH DIFFERENTIAL/PLATELET
Basophils Absolute: 0 10*3/uL (ref 0.0–0.1)
Basophils Relative: 0.6 % (ref 0.0–3.0)
Eosinophils Absolute: 0.1 10*3/uL (ref 0.0–0.7)
Eosinophils Relative: 1.2 % (ref 0.0–5.0)
HCT: 41.9 % (ref 36.0–46.0)
Hemoglobin: 14 g/dL (ref 12.0–15.0)
Lymphocytes Relative: 21.9 % (ref 12.0–46.0)
Lymphs Abs: 1.1 10*3/uL (ref 0.7–4.0)
MCHC: 33.3 g/dL (ref 30.0–36.0)
MCV: 99.2 fl (ref 78.0–100.0)
Monocytes Absolute: 0.3 10*3/uL (ref 0.1–1.0)
Monocytes Relative: 6.1 % (ref 3.0–12.0)
Neutro Abs: 3.5 10*3/uL (ref 1.4–7.7)
Neutrophils Relative %: 70.2 % (ref 43.0–77.0)
Platelets: 238 10*3/uL (ref 150.0–400.0)
RBC: 4.22 Mil/uL (ref 3.87–5.11)
RDW: 13 % (ref 11.5–15.5)
WBC: 5 10*3/uL (ref 4.0–10.5)

## 2021-03-10 LAB — HEMOGLOBIN A1C: Hgb A1c MFr Bld: 5.6 % (ref 4.6–6.5)

## 2021-03-10 LAB — TSH: TSH: 3.61 u[IU]/mL (ref 0.35–5.50)

## 2021-03-10 MED ORDER — LEVOTHYROXINE SODIUM 25 MCG PO TABS: 25.0000 ug | ORAL_TABLET | Freq: Every day | ORAL | 3 refills | Status: DC

## 2021-03-10 MED ORDER — PRAVASTATIN SODIUM 40 MG PO TABS: 40.0000 mg | ORAL_TABLET | Freq: Every evening | ORAL | 3 refills | Status: DC

## 2021-03-10 MED ORDER — VALACYCLOVIR HCL 500 MG PO TABS: 500.0000 mg | ORAL_TABLET | Freq: Every day | ORAL | 3 refills | Status: AC

## 2021-03-10 NOTE — Progress Notes (Signed)
This visit occurred during the SARS-CoV-2 public health emergency.  Safety protocols were in place, including screening questions prior to the visit, additional usage of staff PPE, and extensive cleaning of exam room while observing appropriate contact time as indicated for disinfecting solutions.    Patient ID: Sabrina Allison, female  DOB: February 02, 1961, 60 y.o.   MRN: [id number] Patient Care Team    Relationship Specialty Notifications Start End  Ma Hillock, DO PCP - General Family Medicine  03/29/16     Chief Complaint  Patient presents with   Annual Exam    Pt is fasting    Subjective: Sabrina Allison is a 60 y.o.  Female  present for [place] All past medical history, surgical history, allergies, family history, immunizations, medications and social history were updated in the electronic medical record today. All recent labs, ED visits and hospitalizations within the last year were reviewed.  Health maintenance:  Colonoscopy: completed 01/12/2017, polyps, 5 yr f/u, Dr. Carlean Purl Mammogram: completed 01/2021. breast center.[facility], desires every 2 yr screen. Cervical cancer screening: last pap: 12/2018, est w/ gyn Immunizations: tdap 2017 UTD, Influenza UTD-provided today (encouraged yearly), shingrix series completed. COVID series completed. Infectious disease screening: HIV and Hep C declined DEXA: routine screen Assistive device: none Oxygen [id number] Patient has a Dental home. Hospitalizations/ED visits: reviewed  Hyperlipidemia:  Drastic increase in lipid panel last year 01/2020. Pt declined to start medication, asked to follow up in 3 months and lost to follow up. LDL 182! Today she states she did make dietary changes.   Hypothyroidism:  Pt reports she is tolerating levothyroxine low dose. 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 She had elected not to start medication and  watch until this past august. She reports she has noticed she has stopped get night sweats since starting the medication.   Depression screen [facility] 2/9 03/10/2021 03/26/2020 01/29/2020 01/23/2019 04/13/2018  Decreased Interest 0 0 0 0 0  Down, Depressed, Hopeless 0 0 0 0 0  PHQ - 2 Score 0 0 0 0 0  Altered sleeping 1 - - - -  Tired, decreased energy 0 - - - -  Change in appetite 0 - - - -  Feeling bad or failure about yourself  0 - - - -  Trouble concentrating 0 - - - -  Moving slowly or fidgety/restless 0 - - - -  Suicidal thoughts 0 - - - -  PHQ-9 Score 1 - - - -   GAD 7 : Generalized Anxiety Score 03/10/2021  Nervous, Anxious, on Edge 0  Control/stop worrying 0  Worry too much - different things 0  Trouble relaxing 0  Restless 0  Easily annoyed or irritable 0  Afraid - awful might happen 0  Total GAD 7 Score 0     Immunization History  Administered Date(s) Administered   Hepatitis A 12/04/2015   Hepatitis A, Adult 06/08/2018   Influenza,inj,Quad PF,6+ Mos 03/05/2020, 03/10/2021   PFIZER(Purple Top)SARS-COV-2 Vaccination 08/22/2019, 09/17/2019   Tdap 12/04/2015   Typhoid Live 06/08/2018   Zoster Recombinat (Shingrix) 01/09/2018, 04/13/2018    Past Medical History:  Diagnosis Date   Colon polyp 2015   tubular adenoma, sessile serrated adenoma   Elevated fasting glucose    Genital herpes    Hyperlipidemia    Hypothyroid    Mitral regurgitation 2011   trace   Murmur 2011   Sacroiliitis (  [place]) 2010   Vitamin D deficiency    No Known Allergies Past Surgical History:  Procedure Laterality Date   COLONOSCOPY W/ POLYPECTOMY  2015   tubular adenoma, sessile serrated adenoma - 3 year recall per records.    TUBAL LIGATION     Family History  Problem Relation Age of Onset   Hyperlipidemia Mother    Hypertension Mother    Diabetes Father 68       IDDM at age 13   Alcohol abuse Maternal Grandfather    Diabetes Brother    Breast cancer Neg Hx    Colon cancer Neg Hx     Social History   Social History Narrative   Married to [employer]. 3 children [name]   Master's Degree in teaching.    Drinks caffeine.    Takes a daily vitamin   Wears her seatbelt, bicycle helmet, smoke detector in the home.    Exercises routinely   Feels safe in her relationships.     Allergies as of 03/10/2021   No Known Allergies      Medication List        Accurate as of March 10, 2021  8:37 AM. If you have any questions, ask your nurse or doctor.          levothyroxine 25 MCG tablet Commonly known as: SYNTHROID Take 1 tablet (25 mcg total) by mouth daily before breakfast.   valACYclovir 500 MG tablet Commonly known as: VALTREX Take 1 tablet (500 mg total) by mouth daily.        All past medical history, surgical history, allergies, family history, immunizations andmedications were updated in the EMR today and reviewed under the history and medication portions of their EMR.     No results found for this or any previous visit (from the past 2160 hour(s)).  No results found.   ROS: 14 pt review of systems performed and negative (unless mentioned in an HPI)  Objective: BP 115/83   Pulse (!) 59   Temp (!) 97.4 F (36.3 C)   Ht 5' 1.81" (1.57 m)   Wt 138 lb 9.6 oz (62.9 kg)   SpO2 98%   BMI 25.51 kg/m  Gen: Afebrile. No acute distress. Nontoxic in appearance, well-developed, well-nourished,  pleasant female.  HENT: AT. [place]. Bilateral TM visualized and normal in appearance, normal external auditory canal. MMM, no oral lesions, adequate dentition. Bilateral nares within normal limits. Throat without erythema, ulcerations or exudates. no Cough on exam, no hoarseness on exam. Eyes:Pupils Equal Round Reactive to light, Extraocular movements intact,  Conjunctiva without redness, discharge or icterus. Neck/lymp/endocrine: Supple,no lymphadenopathy, no thyromegaly CV: RRR no murmur, no edema, +2/4 P posterior tibialis pulses.  Chest: CTAB, no  wheeze, rhonchi or crackles. normal Respiratory effort. good Air movement. Abd: Soft. flat. NTND. BS present. no Masses palpated. No hepatosplenomegaly. No rebound tenderness or guarding. Skin: no rashes, purpura or petechiae. Warm and well-perfused. Skin intact. Neuro/Msk: Normal gait. PERLA. EOMi. Alert. Oriented x3.  Cranial nerves II through XII intact. Muscle strength 5/5 upper/lower extremity. DTRs equal bilaterally. Psych: Normal affect, dress and demeanor. Normal speech. Normal thought content and judgment.   No results found.  Assessment/plan: Sabrina Allison is a 60 y.o. female present for CPE/CMC Genital herpes simplex, unspecified site Stable.  - continue valACYclovir (VALTREX) 500 MG tablet; Take 1 tablet (500 mg total) by mouth daily.  Dispense: 90 tablet; Refill: 3  Polyp of colon, unspecified part of colon, unspecified type Rpt due  2023  Mixed hyperlipidemia Working on diet. Encouraged exercise. Goal < 130 for LDL. Her HDL is amazing! - Comprehensive metabolic panel - CBC with Differential/Platelet - Lipid panel  Hypothyroidism:  Stable Continue levo 25 mcg qd if labs collected today prove normal levels.  - TSH - refills will be provided in appropriate dose after resutls.  Diabetes mellitus screening - Hemoglobin A1c Influenza vaccine administered Routine general medical examination at a health care facility Colonoscopy: completed 01/12/2017, polyps, 5 yr f/u, Dr. Carlean Purl Mammogram: completed 01/2021. breast center.[facility], desires every 2 yr screen. Cervical cancer screening: last pap: 12/2018, est w/ gyn Immunizations: tdap 2017 UTD, Influenza UTD-provided today (encouraged yearly), shingrix series completed. COVID series completed +booster. Infectious disease screening: HIV and Hep C declined DEXA: routine screen Patient was encouraged to exercise greater than 150 minutes a week. Patient was encouraged to choose a diet filled with fresh fruits and vegetables, and  lean meats. AVS provided to patient today for education/recommendation on gender specific health and safety maintenance.  Return in about 1 year (around 03/14/2022) for CPE (30 min).  Orders Placed This Encounter  Procedures   Flu Vaccine QUAD 37moIM (Fluarix, Fluzone & Alfiuria Quad PF)   Comprehensive metabolic panel   CBC with Differential/Platelet   Hemoglobin A1c   Lipid panel   TSH   Meds ordered this encounter  Medications   valACYclovir (VALTREX) 500 MG tablet    Sig: Take 1 tablet (500 mg total) by mouth daily.    Dispense:  90 tablet    Refill:  3    Referral Orders  No referral(s) requested today     Electronically signed by: RHoward Pouch [place]

## 2021-03-10 NOTE — Patient Instructions (Addendum)
Great to see you today.  I have refilled the medication(s) we provide.   If labs were collected, we will inform you of lab results once received either by echart message or telephone call.   - echart message- for normal results that have been seen by the patient already.   - telephone call: abnormal results or if patient has not viewed results in their echart. Health Maintenance, Female Adopting a healthy lifestyle and getting preventive care are important in promoting health and wellness. Ask your health care provider about: The right schedule for you to have regular tests and exams. Things you can do on your own to prevent diseases and keep yourself healthy. What should I know about diet, weight, and exercise? Eat a healthy diet  Eat a diet that includes plenty of vegetables, fruits, low-fat dairy products, and lean protein. Do not eat a lot of foods that are high in solid fats, added sugars, or sodium. Maintain a healthy weight Body mass index (BMI) is used to identify weight problems. It estimates body fat based on height and weight. Your health care provider can help determine your BMI and help you achieve or maintain a healthy weight. Get regular exercise Get regular exercise. This is one of the most important things you can do for your health. Most adults should: Exercise for at least 150 minutes each week. The exercise should increase your heart rate and make you sweat (moderate-intensity exercise). Do strengthening exercises at least twice a week. This is in addition to the moderate-intensity exercise. Spend less time sitting. Even light physical activity can be beneficial. Watch cholesterol and blood lipids Have your blood tested for lipids and cholesterol at 59 years of age, then have this test every 5 years. Have your cholesterol levels checked more often if: Your lipid or cholesterol levels are high. You are older than 59 years of age. You are at high risk for heart  disease. What should I know about cancer screening? Depending on your health history and family history, you may need to have cancer screening at various ages. This may include screening for: Breast cancer. Cervical cancer. Colorectal cancer. Skin cancer. Lung cancer. What should I know about heart disease, diabetes, and high blood pressure? Blood pressure and heart disease High blood pressure causes heart disease and increases the risk of stroke. This is more likely to develop in people who have high blood pressure readings, are of African descent, or are overweight. Have your blood pressure checked: Every 3-5 years if you are 18-39 years of age. Every year if you are 40 years old or older. Diabetes Have regular diabetes screenings. This checks your fasting blood sugar level. Have the screening done: Once every three years after age 40 if you are at a normal weight and have a low risk for diabetes. More often and at a younger age if you are overweight or have a high risk for diabetes. What should I know about preventing infection? Hepatitis B If you have a higher risk for hepatitis B, you should be screened for this virus. Talk with your health care provider to find out if you are at risk for hepatitis B infection. Hepatitis C Testing is recommended for: Everyone born from 1945 through 1965. Anyone with known risk factors for hepatitis C. Sexually transmitted infections (STIs) Get screened for STIs, including gonorrhea and chlamydia, if: You are sexually active and are younger than 59 years of age. You are older than 59 years of age and your   health care provider tells you that you are at risk for this type of infection. Your sexual activity has changed since you were last screened, and you are at increased risk for chlamydia or gonorrhea. Ask your health care provider if you are at risk. Ask your health care provider about whether you are at high risk for HIV. Your health care provider  may recommend a prescription medicine to help prevent HIV infection. If you choose to take medicine to prevent HIV, you should first get tested for HIV. You should then be tested every 3 months for as long as you are taking the medicine. Pregnancy If you are about to stop having your period (premenopausal) and you may become pregnant, seek counseling before you get pregnant. Take 400 to 800 micrograms (mcg) of folic acid every day if you become pregnant. Ask for birth control (contraception) if you want to prevent pregnancy. Osteoporosis and menopause Osteoporosis is a disease in which the bones lose minerals and strength with aging. This can result in bone fractures. If you are 65 years old or older, or if you are at risk for osteoporosis and fractures, ask your health care provider if you should: Be screened for bone loss. Take a calcium or vitamin D supplement to lower your risk of fractures. Be given hormone replacement therapy (HRT) to treat symptoms of menopause. Follow these instructions at home: Lifestyle Do not use any products that contain nicotine or tobacco, such as cigarettes, e-cigarettes, and chewing tobacco. If you need help quitting, ask your health care provider. Do not use street drugs. Do not share needles. Ask your health care provider for help if you need support or information about quitting drugs. Alcohol use Do not drink alcohol if: Your health care provider tells you not to drink. You are pregnant, may be pregnant, or are planning to become pregnant. If you drink alcohol: Limit how much you use to 0-1 drink a day. Limit intake if you are breastfeeding. Be aware of how much alcohol is in your drink. In the [place] one drink equals one 12 oz bottle of beer (355 mL), one 5 oz glass of wine (148 mL), or one 1 oz glass of hard liquor (44 mL). General instructions Schedule regular health, dental, and eye exams. Stay current with your vaccines. Tell your health care  provider if: You often feel depressed. You have ever been abused or do not feel safe at home. Summary Adopting a healthy lifestyle and getting preventive care are important in promoting health and wellness. Follow your health care provider's instructions about healthy diet, exercising, and getting tested or screened for diseases. Follow your health care provider's instructions on monitoring your cholesterol and blood pressure. This information is not intended to replace advice given to you by your health care provider. Make sure you discuss any questions you have with your health care provider. Document Revised: 08/21/2020 Document Reviewed: 06/06/2018 Elsevier Patient Education  2022 [employer] Inc[employer]  

## 2021-03-10 NOTE — Telephone Encounter (Signed)
Please call patient Liver, kidney and thyroid function are normal.  I have called in refills for her thyroid medication. Blood cell counts and electrolytes are normal Diabetes screening/A1c is normal unfortunately, her LDL/bad cholesterol is higher even than last year at 193.  Her good cholesterol/H DL still is really great at 103.  However with the LDL of 193 this puts her at a much higher cardiovascular risk of heart attack or stroke within the next 10 years.  Goal for her would be at least below 130 LDL.   -I did go ahead and call in a medication called pravastatin.  This is one of the best tolerated meds out of the statin group.  Statin medications provide not only help lowering the LDL to goal, but also provides extra cardiovascular protection.  I would encourage her to follow-up in 3 months after starting pravastatin so we can ensure she is tolerating medication and her LDL reaches goal to provide her cardiovascular protection.   -Also encouraged her to increase exercise greater than 150 minutes a week.  Low saturated fat diet. -If she elects not to start the statin, she could try red yeast rice which is a natural supplement that helps decrease the bad cholesterol.

## 2021-03-11 ENCOUNTER — Encounter: Payer: Self-pay | Admitting: Family Medicine

## 2021-03-11 MED ORDER — LEVOTHYROXINE SODIUM 25 MCG PO TABS: 25.0000 ug | ORAL_TABLET | Freq: Every day | ORAL | 3 refills | Status: AC

## 2021-03-11 NOTE — Telephone Encounter (Signed)
Mychart message sent for pt to return call

## 2021-03-11 NOTE — Telephone Encounter (Signed)
Spoke with pt regarding labs and instructions. Pt declined statin at this time will CB for 3 mo f/u

## 2021-03-11 NOTE — Telephone Encounter (Signed)
LVM for pt to CB regarding results.  

## 2021-03-12 ENCOUNTER — Encounter: Payer: Self-pay | Admitting: Family Medicine

## 2022-03-14 ENCOUNTER — Encounter: Payer: Self-pay | Admitting: Internal Medicine
